# Patient Record
Sex: Female | Born: 1975 | Race: White | Hispanic: No | Marital: Married | Smoking: Never smoker
Health system: Southern US, Community
[De-identification: ages and names within clinical notes are randomized; demographics above are authoritative.]

## PROBLEM LIST (undated history)

## (undated) DIAGNOSIS — R002 Palpitations: Secondary | ICD-10-CM

## (undated) DIAGNOSIS — N943 Premenstrual tension syndrome: Secondary | ICD-10-CM

## (undated) DIAGNOSIS — K219 Gastro-esophageal reflux disease without esophagitis: Secondary | ICD-10-CM

## (undated) HISTORY — DX: Gastro-esophageal reflux disease without esophagitis: K21.9

## (undated) HISTORY — DX: Palpitations: R00.2

## (undated) HISTORY — PX: INTRAUTERINE DEVICE (IUD) INSERTION: SHX5877

## (undated) HISTORY — DX: Premenstrual tension syndrome: N94.3

## (undated) HISTORY — PX: WISDOM TOOTH EXTRACTION: SHX21

---

## 2003-11-20 ENCOUNTER — Inpatient Hospital Stay (HOSPITAL_COMMUNITY): Admission: AD | Admit: 2003-11-20 | Discharge: 2003-11-20 | Payer: Self-pay | Admitting: Obstetrics and Gynecology

## 2003-12-18 ENCOUNTER — Other Ambulatory Visit: Admission: RE | Admit: 2003-12-18 | Discharge: 2003-12-18 | Payer: Self-pay | Admitting: Obstetrics & Gynecology

## 2004-07-06 ENCOUNTER — Inpatient Hospital Stay (HOSPITAL_COMMUNITY): Admission: AD | Admit: 2004-07-06 | Discharge: 2004-07-06 | Payer: Self-pay | Admitting: Obstetrics & Gynecology

## 2004-07-07 ENCOUNTER — Inpatient Hospital Stay (HOSPITAL_COMMUNITY): Admission: AD | Admit: 2004-07-07 | Discharge: 2004-07-10 | Payer: Self-pay | Admitting: Obstetrics and Gynecology

## 2004-07-07 ENCOUNTER — Encounter (INDEPENDENT_AMBULATORY_CARE_PROVIDER_SITE_OTHER): Payer: Self-pay | Admitting: Specialist

## 2004-09-03 ENCOUNTER — Other Ambulatory Visit: Admission: RE | Admit: 2004-09-03 | Discharge: 2004-09-03 | Payer: Self-pay | Admitting: Obstetrics & Gynecology

## 2007-08-30 LAB — CONVERTED CEMR LAB: Pap Smear: NORMAL

## 2008-10-30 ENCOUNTER — Ambulatory Visit: Payer: Self-pay | Admitting: Family Medicine

## 2008-10-30 DIAGNOSIS — N943 Premenstrual tension syndrome: Secondary | ICD-10-CM | POA: Insufficient documentation

## 2008-10-30 HISTORY — DX: Premenstrual tension syndrome: N94.3

## 2009-01-06 ENCOUNTER — Ambulatory Visit: Payer: Self-pay | Admitting: Family Medicine

## 2009-04-04 ENCOUNTER — Ambulatory Visit: Payer: Self-pay | Admitting: Family Medicine

## 2009-04-09 ENCOUNTER — Ambulatory Visit: Payer: Self-pay | Admitting: Family Medicine

## 2009-08-11 ENCOUNTER — Ambulatory Visit: Payer: Self-pay | Admitting: Family Medicine

## 2009-08-11 LAB — CONVERTED CEMR LAB
Bilirubin Urine: NEGATIVE
Blood in Urine, dipstick: NEGATIVE
Glucose, Urine, Semiquant: NEGATIVE
Ketones, urine, test strip: NEGATIVE
Nitrite: NEGATIVE
Protein, U semiquant: NEGATIVE
Specific Gravity, Urine: <1.005
Urobilinogen, UA: NEGATIVE
WBC Urine, dipstick: NEGATIVE
pH: 5

## 2009-08-18 LAB — CONVERTED CEMR LAB
ALT: 17 units/L (ref 0–35)
AST: 23 units/L (ref 0–37)
Albumin: 4.5 g/dL (ref 3.5–5.2)
Alkaline Phosphatase: 87 units/L (ref 39–117)
BUN: 13 mg/dL (ref 6–23)
Basophils Absolute: 0 10*3/uL (ref 0.0–0.1)
Basophils Relative: 0.6 % (ref 0.0–3.0)
Bilirubin, Direct: 0.2 mg/dL (ref 0.0–0.3)
CO2: 26 meq/L (ref 19–32)
Calcium: 9.6 mg/dL (ref 8.4–10.5)
Chloride: 105 meq/L (ref 96–112)
Cholesterol: 175 mg/dL (ref 0–200)
Creatinine, Ser: 0.8 mg/dL (ref 0.4–1.2)
Eosinophils Absolute: 0.1 10*3/uL (ref 0.0–0.7)
Eosinophils Relative: 1.8 % (ref 0.0–5.0)
GFR calc non Af Amer: 86.32 mL/min (ref >60)
Glucose, Bld: 78 mg/dL (ref 70–99)
HCT: 42.4 % (ref 36.0–46.0)
HDL: 63.9 mg/dL (ref >39.00)
Hemoglobin: 13.9 g/dL (ref 12.0–15.0)
LDL Cholesterol: 102 mg/dL — ABNORMAL HIGH (ref 0–99)
Lymphocytes Relative: 24.2 % (ref 12.0–46.0)
Lymphs Abs: 1.8 10*3/uL (ref 0.7–4.0)
MCHC: 32.7 g/dL (ref 30.0–36.0)
MCV: 93.7 fL (ref 78.0–100.0)
Monocytes Absolute: 0.7 10*3/uL (ref 0.1–1.0)
Monocytes Relative: 9.2 % (ref 3.0–12.0)
Neutro Abs: 4.8 10*3/uL (ref 1.4–7.7)
Neutrophils Relative %: 64.2 % (ref 43.0–77.0)
Platelets: 191 10*3/uL (ref 150.0–400.0)
Potassium: 4.6 meq/L (ref 3.5–5.1)
RBC: 4.53 M/uL (ref 3.87–5.11)
RDW: 11.9 % (ref 11.5–14.6)
Sodium: 139 meq/L (ref 135–145)
TSH: 1.22 microintl units/mL (ref 0.35–5.50)
Total Bilirubin: 1.4 mg/dL — ABNORMAL HIGH (ref 0.3–1.2)
Total CHOL/HDL Ratio: 3
Total Protein: 7.7 g/dL (ref 6.0–8.3)
Triglycerides: 48 mg/dL (ref 0.0–149.0)
VLDL: 9.6 mg/dL (ref 0.0–40.0)
WBC: 7.4 10*3/uL (ref 4.5–10.5)

## 2009-09-08 ENCOUNTER — Ambulatory Visit: Payer: Self-pay | Admitting: Family Medicine

## 2009-11-12 ENCOUNTER — Telehealth: Payer: Self-pay | Admitting: Family Medicine

## 2010-08-18 NOTE — Assessment & Plan Note (Signed)
Summary: new to establish//fd   Vital Signs:  Patient profile:   35 year old female Height:      59 inches Weight:      147.2 pounds BMI:     29.84 Pulse rate:   58 / minute Resp:     12 per minute BP sitting:   112 / 64  (left arm)  Vitals Entered By: Doristine Devoid (October 30, 2008 8:11 AM) CC: new est- mood swings    History of Present Illness: 35 yo woman here today to establish care.  did not have prior PCP.  Taavon is GYN.  pt here today for mood swings.  pt reports that when she called yesterday 'it was the worst of it'.  for 2 weeks out of each month 'i'm very quick to yell, i lose motivation, i cry easily- the house falls apart'.  sxs x5 yrs.  pt knows that sxs occur during the 2 weeks preceding menses.  when menses starts pt's mood dramatically improves.  pt would label sxs as 'depression' during those 2 weeks and then for the other 2 weeks in the month is fine.  'i'm like 2 different people'.  no sxs of mania- no risk taking behavior, decreased need for sleep, spending money.  not on birth control- husband s/p vasectomy.  pt now exercising regularly- finds this is helping.  Preventive Screening-Counseling & Management     Smoking Status: never      Sexual History:  currently monogamous.    Allergies (verified): No Known Drug Allergies  Past History:  Past Surgical History:    Caesarean section  Past History:  Care Management:    Gynecology: Dr. Billy Coast  Family History:    CAD-no    HTN-no    DM-paternal grandmother    COLON CA-no    BREAST CA-no    STROKE-no  Social History:    married, 2 children (2002, 2006)    Smoking Status:  never    Sexual History:  currently monogamous  Review of Systems General:  Denies chills, fatigue, fever, and sleep disorder. Psych:  Complains of anxiety, depression, easily angered, easily tearful, and irritability; denies sense of great danger, suicidal thoughts/plans, thoughts of violence, unusual visions or sounds, and  thoughts /plans of harming others.  Physical Exam  General:  Well-developed,well-nourished,in no acute distress; alert,appropriate and cooperative throughout examination Neck:  No deformities, masses, or tenderness noted. Lungs:  Normal respiratory effort, chest expands symmetrically. Lungs are clear to auscultation, no crackles or wheezes. Heart:  Normal rate and regular rhythm. S1 and S2 normal without gallop, murmur, click, rub or other extra sounds. Extremities:  no C/C/E Psych:  Cognition and judgment appear intact. Alert and cooperative with normal attention span and concentration. 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  discussed at length treatment options- OCPs vs SSRI.  pt w/ hx of OCPs and severe yeast infxns- would prefer different option.  after r/o sxs of mania will start SSRI and follow closely.  encouraged lifestyle changes like diet and exercise.  pt in agreement.  Complete Medication List: 1)  Citalopram Hydrobromide 20 Mg Tabs (Citalopram hydrobromide) .... Take one tablet by mouth daily  Patient Instructions: 1)  Please schedule a follow-up appointment in 6 weeks. 2)  Take the Citalopram daily to help with mood 3)  Continue to exercise- this will only help things! 4)  Call with any questions or concerns 5)  Hang in there! Prescriptions: CITALOPRAM HYDROBROMIDE 20 MG TABS (CITALOPRAM HYDROBROMIDE) take one tablet by mouth daily  #30 x 3   Entered and Authorized by:   Neena Rhymes MD   Signed by:   Neena Rhymes MD on 10/30/2008   Method used:   Print then Give to Patient   RxID:   [id number]

## 2010-08-18 NOTE — Assessment & Plan Note (Signed)
Summary: 2 WEEK COUGH/NOT FEELING ANY BETTER/KDC   Vital Signs:  Patient profile:   35 year old female Weight:      143.8 pounds Temp:     98.6 degrees F oral BP sitting:   112 / 68  (left arm)  Vitals Entered By: Doristine Devoid (April 09, 2009 11:33 AM) CC: still w/ cough    History of Present Illness: 35 yo woman here today for persistant cough.  seen 5 days ago and tx'd w/ Zpack for bronchitis.  cough is unchanged since Zpack.  otherwise feels fine.  no fevers, chills, ear pain, congestion, sore throat.  cough is still intermittantly productive, hacking cough.  Current Medications (verified): 1)  Citalopram Hydrobromide 20 Mg Tabs (Citalopram Hydrobromide) .... Take One Tablet By Mouth Daily 2)  Tessalon 200 Mg Caps (Benzonatate) .... Take One Capsule By Mouth Three Times A Day As Needed For Cough 3)  Cheratussin Ac 100-10 Mg/68ml Syrp (Guaifenesin-Codeine) .Marland Kitchen.. 1-2 Tsps Q4-6 As Needed For Cough.  Disp  Allergies (verified): No Known Drug Allergies  Review of Systems      See HPI  Physical Exam  General:  Well-developed,well-nourished,in no acute distress; alert,appropriate and cooperative throughout examination Eyes:  no injxn or inflammation Nose:  External nasal examination shows no deformity or inflammation. Nasal mucosa are pink and moist without lesions or exudates. Mouth:  Oral mucosa and oropharynx without lesions or exudates.  Teeth in good repair. Lungs:  Normal respiratory effort, chest expands symmetrically. Lungs are clear to auscultation, no crackles or wheezes.  + hacking cough Heart:  Normal rate and regular rhythm. S1 and S2 normal without gallop, murmur, click, rub or other extra sounds.   Impression & Recommendations:  Problem # 1:  BRONCHITIS- ACUTE (ICD-466.0) Assessment Unchanged pt now feeling well but still w/ cough.  likely post-infectious at this point.  codeine cough syrup for night sxs and tessalon for day cough.  reviewed supportive  care and red flags that should prompt return.  Pt expresses understanding and is in agreement w/ this plan. Her updated medication list for this problem includes:    Tessalon 200 Mg Caps (Benzonatate) .Marland Kitchen... Take one capsule by mouth three times a day as needed for cough    Cheratussin Ac 100-10 Mg/16ml Syrp (Guaifenesin-codeine) .Marland Kitchen... 1-2 tsps q4-6 as needed for cough.  disp  Complete Medication List: 1)  Citalopram Hydrobromide 20 Mg Tabs (Citalopram hydrobromide) .... Take one tablet by mouth daily 2)  Tessalon 200 Mg Caps (Benzonatate) .... Take one capsule by mouth three times a day as needed for cough 3)  Cheratussin Ac 100-10 Mg/51ml Syrp (Guaifenesin-codeine) .Marland Kitchen.. 1-2 tsps q4-6 as needed for cough.  disp  Patient Instructions: 1)  Please schedule a follow-up appointment as needed. 2)  Take the cough syrup at night (will make you drowsy) 3)  Use the cough pills (Tessalon) for day cough- can take w/ Robitussin if needed 4)  Drink plenty of fluids 5)  Ibuprofen every 6-8 hrs for airway inflammation 6)  Hang in there! Prescriptions: CHERATUSSIN AC 100-10 MG/5ML SYRP (GUAIFENESIN-CODEINE) 1-2 tsps Q4-6 as needed for cough.  disp  #150 x 0   Entered and Authorized by:   Neena Rhymes MD   Signed by:   Neena Rhymes MD on 04/09/2009   Method used:   Print then Give to Patient   RxID:   [id number] TESSALON 200 MG CAPS (BENZONATATE) Take one capsule by mouth three times a day as needed  for cough  #60 x 0   Entered and Authorized by:   Neena Rhymes MD   Signed by:   Neena Rhymes MD on 04/09/2009   Method used:   Print then Give to Patient   RxID:   [id number]

## 2010-08-18 NOTE — Letter (Signed)
Summary: Cancer Screening/[facility] Personalized Risk Profile  Cancer Screening/[facility] Personalized Risk Profile   Imported By: Lanelle Bal 08/15/2009 12:21:31  _____________________________________________________________________  External Attachment:    Type:   Image     Comment:   External Document

## 2010-08-18 NOTE — Assessment & Plan Note (Signed)
Summary: FU ON MEDS/K[facility]   Vital Signs:  Patient profile:   35 year old female Weight:      146.2 pounds Pulse rate:   68 / minute BP sitting:   116 / 70  (left arm)  Vitals Entered By: Doristine Devoid (January 06, 2009 1:38 PM) CC: f/u on meds    History of Present Illness: 35 yo woman here today to f/u on meds.  pt reports feeling much better since starting citalopram.  still w/ PMS but 'normal'- irritated but no depressive sxs.  'it's an amazing difference'.  both husband and son have noticed- 'you don't yell as much as used to'.    Allergies: No Known Drug Allergies  Review of Systems      See HPI  Physical Exam  General:  Well-developed,well-nourished,in no acute distress; alert,appropriate and cooperative throughout examination Psych:  Cognition and judgment appear intact. Alert and cooperative with normal attention span and concentration. No apparent delusions, illusions, hallucinations   Impression & Recommendations:  Problem # 1:  PREMENSTRUAL DYSPHORIC SYNDROME (ICD-625.4) Assessment Improved pt's sxs much improved since starting SSRI.  no changes at this time.  will follow.  Complete Medication List: 1)  Citalopram Hydrobromide 20 Mg Tabs (Citalopram hydrobromide) .... Take one tablet by mouth daily  Patient Instructions: 1)  Please schedule a complete physical at your convenience- if blood work done at [facility] do not have to fast 2)  Continue the Citalopram daily as directed 3)  Call with any questions or concerns! 4)  Have a great summer!! Prescriptions: CITALOPRAM HYDROBROMIDE 20 MG TABS (CITALOPRAM HYDROBROMIDE) take one tablet by mouth daily  #30 x 6   Entered and Authorized by:   Neena Rhymes MD   Signed by:   Neena Rhymes MD on 01/06/2009   Method used:   Electronically to        [facility] (retail)       [address]       [place], [place]  [address]       Ph: [phone]       Fax: [phone]   RxID:    [id number]

## 2010-08-18 NOTE — Assessment & Plan Note (Signed)
Summary: CPX AND SHOTS AND FASTING LABS//PH   Vital Signs:  Patient profile:   34 year old female Height:      60 inches Weight:      148 pounds BMI:     29.01 Temp:     98.0 degrees F oral Pulse rate:   69 / minute Pulse rhythm:   regular BP sitting:   129 / 78  (left arm) Cuff size:   regular  Vitals Entered By: Army Fossa CMA (August 11, 2009 8:28 AM) CC: CPX, no pap, no compliants. Pt is fasting. Pt needs varicella and boostrix.    History of Present Illness: Pt here for cpe---no pap.  Pt has gyn.   No complaints.    Preventive Screening-Counseling & Management  Alcohol-Tobacco     Alcohol drinks/day: <1     Alcohol type: wine     Alcohol Counseling: not indicated; use of alcohol is not excessive or problematic     Smoking Status: never  Caffeine-Diet-Exercise     Caffeine use/day: 2     Does Patient Exercise: yes     Type of exercise: running, weights     Exercise (avg: min/session): 30-60     Times/week: 5     Exercise Counseling: not indicated; exercise is adequate  Hep-HIV-STD-Contraception     Dental Visit-last 6 months yes     Dental Care Counseling: not indicated; dental care within six months     SBE monthly: no     SBE Education/Counseling: to perform regular SBE  Safety-Violence-Falls     Seat Belt Use: yes     Seat Belt Counseling: not indicated; patient wears seat belts      Sexual History:  currently monogamous and married-- 2 children.        Drug Use:  no.        Blood Transfusions:  no.    Current Medications (verified): 1)  Citalopram Hydrobromide 20 Mg Tabs (Citalopram Hydrobromide) .... Take One Tablet By Mouth Daily  Allergies (verified): 1)  ! Percocet  Past History:  Past Surgical History: Last updated: 10/30/2008 Caesarean section  Family History: Last updated: 08/11/2009 CAD-no HTN-no DM-paternal grandmother COLON CA-no BREAST CA-no STROKE-no Family History Diabetes 1st degree relative Father ---brain  aneurysm Family History High cholesterol Family History Hypertension PGF--stomach and throat CA  Social History: Last updated: 08/11/2009 married, 2 children (2002, 2006) Occupation: [employer] Married Never Smoked Alcohol use-yes Drug use-no Regular exercise-yes  Risk Factors: Alcohol Use: <1 (08/11/2009) Caffeine Use: 2 (08/11/2009) Exercise: yes (08/11/2009)  Risk Factors: Smoking Status: never (08/11/2009)  Past Medical History: Unremarkable  Family History: Reviewed history from 10/30/2008 and no changes required. CAD-no HTN-no DM-paternal grandmother COLON CA-no BREAST CA-no STROKE-no Family History Diabetes 1st degree relative Father ---brain aneurysm Family History High cholesterol Family History Hypertension PGF--stomach and throat CA  Social History: Reviewed history from 10/30/2008 and no changes required. married, 2 children (2002, 2006) Occupation: [employer] Married Never Smoked Alcohol use-yes Drug use-no Regular exercise-yes Does Patient Exercise:  yes Caffeine use/day:  2 Dental Care w/in 6 mos.:  yes Seat Belt Use:  yes Sexual History:  currently monogamous, married-- 2 children Blood Transfusions:  no Occupation:  employed Drug Use:  no  Review of Systems      See HPI General:  Denies chills, fatigue, fever, loss of appetite, malaise, sleep disorder, sweats, weakness, and weight loss. Eyes:  Denies blurring, discharge, double vision, eye irritation, eye pain, halos, itching, light sensitivity, red eye, vision  loss-1 eye, and vision loss-both eyes; optho-- q2y . ENT:  Denies decreased hearing, difficulty swallowing, ear discharge, earache, hoarseness, nasal congestion, nosebleeds, postnasal drainage, ringing in ears, sinus pressure, and sore throat. CV:  Denies bluish discoloration of lips or nails, chest pain or discomfort, difficulty breathing at night, difficulty breathing while lying down, fainting, fatigue, leg cramps with exertion,  lightheadness, near fainting, palpitations, shortness of breath with exertion, swelling of feet, swelling of hands, and weight gain. Resp:  Denies chest discomfort, chest pain with inspiration, cough, coughing up blood, excessive snoring, hypersomnolence, morning headaches, pleuritic, shortness of breath, sputum productive, and wheezing. GI:  Denies abdominal pain, bloody stools, change in bowel habits, constipation, dark tarry stools, diarrhea, excessive appetite, gas, hemorrhoids, indigestion, loss of appetite, nausea, vomiting, vomiting blood, and yellowish skin color. GU:  Denies abnormal vaginal bleeding, decreased libido, discharge, dysuria, genital sores, hematuria, incontinence, nocturia, urinary frequency, and urinary hesitancy. MS:  Denies joint pain, joint redness, joint swelling, loss of strength, low back pain, mid back pain, muscle aches, muscle , cramps, muscle weakness, stiffness, and thoracic pain. Derm:  Denies changes in color of skin, changes in nail beds, dryness, excessive perspiration, flushing, hair loss, insect bite(s), itching, lesion(s), poor wound healing, and rash. Neuro:  Denies brief paralysis, difficulty with concentration, disturbances in coordination, falling down, headaches, inability to speak, memory loss, numbness, poor balance, seizures, sensation of room spinning, tingling, tremors, visual disturbances, and weakness. Psych:  Denies alternate hallucination ( auditory/visual), anxiety, depression, easily angered, easily tearful, irritability, mental problems, panic attacks, sense of great danger, suicidal thoughts/plans, thoughts of violence, unusual visions or sounds, and thoughts /plans of harming others. Endo:  Denies cold intolerance, excessive hunger, excessive thirst, excessive urination, heat intolerance, polyuria, and weight change. Heme:  Denies abnormal bruising, bleeding, enlarge lymph nodes, fevers, pallor, and skin discoloration. Allergy:  Denies hives or  rash, itching eyes, persistent infections, seasonal allergies, and sneezing.  Physical Exam  General:  Well-developed,well-nourished,in no acute distress; alert,appropriate and cooperative throughout examination Head:  Normocephalic and atraumatic without obvious abnormalities. No apparent alopecia or balding. Eyes:  pupils equal, pupils round, pupils reactive to light, and no injection.   Ears:  External ear exam shows no significant lesions or deformities.  Otoscopic examination reveals clear canals, tympanic membranes are intact bilaterally without bulging, retraction, inflammation or discharge. Hearing is grossly normal bilaterally. Nose:  External nasal examination shows no deformity or inflammation. Nasal mucosa are pink and moist without lesions or exudates. Mouth:  Oral mucosa and oropharynx without lesions or exudates.  Teeth in good repair. Neck:  No deformities, masses, or tenderness noted. Chest Wall:  No deformities, masses, or tenderness noted. Breasts:  gyn Lungs:  Normal respiratory effort, chest expands symmetrically. Lungs are clear to auscultation, no crackles or wheezes. Heart:  Normal rate and regular rhythm. S1 and S2 normal without gallop, murmur, click, rub or other extra sounds. Abdomen:  Bowel sounds positive,abdomen soft and non-tender without masses, organomegaly or hernias noted. Genitalia:  gyn  Msk:  normal ROM, no joint tenderness, no joint swelling, no joint warmth, no redness over joints, no joint deformities, no joint instability, and no crepitation.   Pulses:  R posterior tibial normal, R dorsalis pedis normal, L posterior tibial normal, and L dorsalis pedis normal.   Extremities:  No clubbing, cyanosis, edema, or deformity noted with normal full range of motion of all joints.   Neurologic:  alert & oriented X3, cranial nerves II-XII intact, strength normal in all extremities,  and gait normal.   Skin:  Intact without suspicious lesions or rashes Cervical  Nodes:  No lymphadenopathy noted Axillary Nodes:  No palpable lymphadenopathy Psych:  Cognition and judgment appear intact. Alert and cooperative with normal attention span and concentration. No apparent delusions, illusions, hallucinations    Flu Vaccine Consent Questions     Do you have a history of severe allergic reactions to this vaccine? no    Any prior history of allergic reactions to egg and/or gelatin? no    Do you have a sensitivity to the preservative Thimersol? no    Do you have a past history of Guillan-Barre Syndrome? no    Do you currently have an acute febrile illness? no    Have you ever had a severe reaction to latex? no    Vaccine information given and explained to patient? yes    Are you currently pregnant? no    Lot Number:[id number]   Exp Date:01/15/2010   Site Given  right Deltoid IM .lbflu  Impression & Recommendations:  Problem # 1:  PREVENTIVE HEALTH CARE (ICD-V70.0) ghm utd Orders: Venipuncture ([address]) TLB-Lipid Panel (80061-LIPID) TLB-BMP (Basic Metabolic Panel-BMET) (80048-METABOL) TLB-CBC Platelet - w/Differential (85025-CBCD) TLB-Hepatic/Liver Function Pnl (80076-HEPATIC) TLB-TSH (Thyroid Stimulating Hormone) (84443-TSH) UA Dipstick W/ Micro (manual) (60454)  Problem # 2:  PREMENSTRUAL DYSPHORIC SYNDROME (ICD-625.4)  cont celexa pt may need 30 mg---  she will wait another month.  If she has problems with moods next month again whe will increase dose.    Complete Medication List: 1)  Citalopram Hydrobromide 20 Mg Tabs (Citalopram hydrobromide) .... Take one tablet by mouth daily  Other Orders: Varicella  ([address]) Admin 1st Vaccine [phone]) Tdap => 14yrs IM (29562) Admin of Any Addtl Vaccine (13086) Admin 1st Vaccine (57846) Flu Vaccine 34yrs + ([address]) Prescriptions: CITALOPRAM HYDROBROMIDE 20 MG TABS (CITALOPRAM HYDROBROMIDE) take one tablet by mouth daily  #30 x 6   Entered and Authorized by:   Loreen Freud DO   Signed by:   Loreen Freud DO  on 08/11/2009   Method used:   Electronically to        [facility] (retail)       [address]       [place], [place]  [address]       Ph: [phone]       Fax: [phone]   RxID:   [id number]    Immunizations Administered:  Varicella Vaccine # 1:    Vaccine Type: Varicella    Site: right deltoid    Mfr: Merck    Dose: 0.5 ml    Route: IM    Given by: Army Fossa CMA    Exp. Date: 02/06/2011    Lot #: [id number]  Tetanus Vaccine:    Vaccine Type: Tdap    Site: left deltoid    Mfr: GlaxoSmithKline    Dose: 0.5 ml    Route: IM    Given by: Army Fossa CMA    Exp. Date: 09/13/2011    Lot #: [id number]   Immunizations Administered:  Varicella Vaccine # 1:    Vaccine Type: Varicella    Site: right deltoid    Mfr: Merck    Dose: 0.5 ml    Route: IM    Given by: Army Fossa CMA    Exp. Date: 02/06/2011    Lot #: [id number]  Tetanus Vaccine:    Vaccine Type: Tdap    Site: left deltoid  Mfr: GlaxoSmithKline    Dose: 0.5 ml    Route: IM    Given by: Army Fossa CMA    Exp. Date: 09/13/2011    Lot #: [id number]   Flu Vaccine Result Date:  08/11/2009 Flu Vaccine Result:  given Flu Vaccine Next Due:  1 yr TD Result Date:  08/11/2009 TD Result:  given TD Next Due:  10 yr PAP Result Date:  08/30/2007 PAP Result:  normal PAP Next Due:  1 yr   Laboratory Results   Urine Tests   Date/Time Reported: August 11, 2009 10:40 AM   Routine Urinalysis   Color: lt. yellow Appearance: Clear Glucose: negative   (Normal Range: Negative) Bilirubin: negative   (Normal Range: Negative) Ketone: negative   (Normal Range: Negative) Spec. Gravity: <1.005   (Normal Range: 1.003-1.035) Blood: negative   (Normal Range: Negative) pH: 5.0   (Normal Range: 5.0-8.0) Protein: negative   (Normal Range: Negative) Urobilinogen: negative   (Normal Range: 0-1) Nitrite: negative   (Normal Range: Negative) Leukocyte Esterace:  negative   (Normal Range: Negative)    Comments: Floydene Flock  August 11, 2009 10:40 AM

## 2010-08-18 NOTE — Progress Notes (Signed)
Summary: refill-low[place]  Phone Note Refill Request Call back at Home Phone [phone] Message from:  Patient  Refills Requested: Medication #1:  CITALOPRAM HYDROBROMIDE 20 MG TABS take one tablet by mouth daily. pt states that she discuss increase med to 30mg .[facility] [address]. pls advise pt aware out of office until thursday 11-13-09  Initial call taken by: [facility] CMA,  November 12, 2009 4:04 PM  Follow-up for Phone Call        1 1/2 tab by mouth once daily   # 45  5 refills Follow-up by: Loreen Freud DO,  November 13, 2009 9:36 AM  Additional Follow-up for Phone Call Additional follow up Details #1::        Called pt and left a message that we sent in a new rx with directions to the [facility] [address]. Army Fossa CMA  November 13, 2009 9:49 AM     New/Updated Medications: CITALOPRAM HYDROBROMIDE 20 MG TABS (CITALOPRAM HYDROBROMIDE) take 1 1/2  tablet by mouth daily Prescriptions: CITALOPRAM HYDROBROMIDE 20 MG TABS (CITALOPRAM HYDROBROMIDE) take 1 1/2  tablet by mouth daily  #45 x 5   Entered by:   Army Fossa CMA   Authorized by:   Loreen Freud DO   Signed by:   Army Fossa CMA on 11/13/2009   Method used:   Electronically to        [facility] (retail)       [address]       [place], [place]  [address]       Ph: [phone]       Fax: [phone]   RxID:   [id number]

## 2010-08-18 NOTE — Assessment & Plan Note (Signed)
Summary: ACUTE ONLY FOR A COUGH//PH   Vital Signs:  Patient profile:   35 year old female Weight:      140.13 pounds Temp:     98.6 degrees F oral Pulse rate:   72 / minute Pulse rhythm:   regular BP sitting:   118 / 72  (left arm) Cuff size:   regular  Vitals Entered By: Army Fossa CMA (April 04, 2009 12:53 PM) CC: cough with mucus in it, she has tried mucinex and claritin. , URI symptoms   History of Present Illness:       This is a 35 year old woman who presents with URI symptoms.  The patient complains of productive cough, but denies nasal congestion, clear nasal discharge, purulent nasal discharge, sore throat, dry cough, earache, and sick contacts.  The patient denies fever, low-grade fever (<100.5 degrees), fever of 100.5-103 degrees, fever of 103.1-104 degrees, fever to >104 degrees, stiff neck, dyspnea, wheezing, rash, vomiting, diarrhea, use of an antipyretic, and response to antipyretic.  The patient denies itchy watery eyes, itchy throat, sneezing, seasonal symptoms, response to antihistamine, headache, muscle aches, and severe fatigue.  The patient denies the following risk factors for Strep sinusitis: unilateral facial pain, unilateral nasal discharge, poor response to decongestant, double sickening, tooth pain, Strep exposure, tender adenopathy, and absence of cough.    Current Medications (verified): 1)  Citalopram Hydrobromide 20 Mg Tabs (Citalopram Hydrobromide) .... Take One Tablet By Mouth Daily 2)  Zithromax Z-Pak 250 Mg Tabs (Azithromycin) .... 2 By Mouth X1 Day Then 1 By Mouth Once Daily For 4 More Days  Allergies (verified): No Known Drug Allergies  Past History:  Past medical, surgical, family and social histories (including risk factors) reviewed, and no changes noted (except as noted below).  Past Surgical History: Reviewed history from 10/30/2008 and no changes required. Caesarean section  Family History: Reviewed history from 10/30/2008 and  no changes required. CAD-no HTN-no DM-paternal grandmother COLON CA-no BREAST CA-no STROKE-no  Social History: Reviewed history from 10/30/2008 and no changes required. married, 2 children (2002, 2006)  Review of Systems      See HPI  Physical Exam  General:  Well-developed,well-nourished,in no acute distress; alert,appropriate and cooperative throughout examination Ears:  External ear exam shows no significant lesions or deformities.  Otoscopic examination reveals clear canals, tympanic membranes are intact bilaterally without bulging, retraction, inflammation or discharge. Hearing is grossly normal bilaterally. Nose:  External nasal examination shows no deformity or inflammation. Nasal mucosa are pink and moist without lesions or exudates. Mouth:  Oral mucosa and oropharynx without lesions or exudates.  Teeth in good repair. Neck:  No deformities, masses, or tenderness noted. Lungs:  Normal respiratory effort, chest expands symmetrically. Lungs are clear to auscultation, no crackles or wheezes. Heart:  Normal rate and regular rhythm. S1 and S2 normal without gallop, murmur, click, rub or other extra sounds. Psych:  Cognition and judgment appear intact. Alert and cooperative with normal attention span and concentration. No apparent delusions, illusions, hallucinations   Impression & Recommendations:  Problem # 1:  BRONCHITIS- ACUTE (ICD-466.0)  Her updated medication list for this problem includes:    Zithromax Z-pak 250 Mg Tabs (Azithromycin) .[facility]... 2 by mouth x1 day then 1 by mouth once daily for 4 more days     Mucinex  Take antibiotics and other medications as directed. Encouraged to push clear liquids, get enough rest, and take acetaminophen as needed. To be seen in 5-7 days if no improvement, sooner if  worse.  Complete Medication List: 1)  Citalopram Hydrobromide 20 Mg Tabs (Citalopram hydrobromide) .... Take one tablet by mouth daily 2)  Zithromax Z-pak 250 Mg Tabs  (Azithromycin) .... 2 by mouth x1 day then 1 by mouth once daily for 4 more days Prescriptions: ZITHROMAX Z-PAK 250 MG TABS (AZITHROMYCIN) 2 by mouth x1 day then 1 by mouth once daily for 4 more days  #1 x 0   Entered and Authorized by:   Loreen Freud DO   Signed by:   Loreen Freud DO on 04/04/2009   Method used:   Electronically to        [facility] ([facility]ail)       [address]       [place], [place]  [address]       Ph: [phone]       Fax: [phone]   RxID:   [id number]

## 2010-09-01 ENCOUNTER — Telehealth (INDEPENDENT_AMBULATORY_CARE_PROVIDER_SITE_OTHER): Payer: Self-pay | Admitting: *Deleted

## 2010-09-09 NOTE — Progress Notes (Signed)
Summary: refill  Phone Note Refill Request Message from:  Fax from Pharmacy on September 01, 2010 8:20 AM  Refills Requested: Medication #1:  CITALOPRAM HYDROBROMIDE 20 MG TABS take 1 1/2  tablet by mouth daily. [facility] - fax [phone]  Initial call taken by: Okey Regal [place],  September 01, 2010 8:21 AM    Prescriptions: CITALOPRAM HYDROBROMIDE 20 MG TABS (CITALOPRAM HYDROBROMIDE) take 1 1/2  tablet by mouth daily  #45 Tablet x 2   Entered by:   Doristine Devoid CMA   Authorized by:   Neena Rhymes MD   Signed by:   Doristine Devoid CMA on 09/01/2010   Method used:   Electronically to        [facility])       [address]       [place], [place]  [address]       Ph: [phone]       Fax: [phone]   RxID:   [id number]

## 2010-11-16 ENCOUNTER — Encounter: Payer: Self-pay | Admitting: Family Medicine

## 2010-12-04 NOTE — Discharge Summary (Signed)
Phyllis Shelton, Phyllis Shelton             ACCOUNT NO.:  [id number]   MEDICAL RECORD NO.:  [id number]          PATIENT TYPE:  INP   LOCATION:  9141                          FACILITY:  [facility]   PHYSICIAN:  Duke Salvia. Marcelle Overlie, M.D.DATE OF BIRTH:  [birth year]   DATE OF ADMISSION:  07/07/2004  DATE OF DISCHARGE:  07/10/2004                                 DISCHARGE SUMMARY   ADMITTING DIAGNOSES:  1.  Intrauterine pregnancy at 17 weeks estimated gestational age.  2.  Spontaneous rupture of membranes.  3.  Meconium-stained fluid.   DISCHARGE DIAGNOSIS:  Status post low transverse cesarean section secondary  to failure to progress and nonreassuring fetal heart tones.   PROCEDURE:  Primary low transverse cesarean section.   REASON FOR ADMISSION:  Please see written H&P.   HOSPITAL COURSE:  The patient was a 35 year old gravida 2 para 1 that  presented to [facility] at 38 weeks estimated gestational  age in active labor with spontaneous rupture of membranes.  Amniotic fluid  was noted to be moderately stained with meconium and cervix was found to be  7 cm dilated with the patient contracting approximately every 2-4 minutes.  The patient did undergo amnioinfusion for moderate meconium fluid.  After  approximately 4 hours the patient did progress to 8-9 cm.  However, 2-and-a-  half hours with adequate labor, the patient made no further progression.  Fetal vertex was noted to be in the occiput posterior position.  The fetus  did develop fetal tachycardia with heart rate in the 70s and 80s and was  losing beat-to-beat variability.  Due to failure to progress with occiput  posterior presentation and fetal tachycardia, decision was made to proceed  with a primary low transverse cesarean section.  The patient was then  transferred to operating room where epidural was dosed to an adequate  surgical level.  A low transverse incision was made with the delivery of a  viable female infant  weighing 7 pounds 6 ounces with Apgars of 9 at one  minute and 9 at five minutes.  Arterial cord pH was 7.30.  The patient  tolerated the procedure well and was taken to the recovery room in stable  condition.  On postoperative day #1, vital signs were stable, she was  afebrile.  Fundus was firm and nontender.  Abdominal dressing was noted to  be clean, dry, and intact.  Labs revealed hemoglobin of 13.0; platelet count  176,000; wbc count of 26.3.  On postoperative day #2, vital signs were  stable, the patient was afebrile.  Abdomen was soft, fundus was firm and  nontender.  Abdominal dressing had been removed revealing an incision that  was clean, dry, and intact.  Labs revealed hemoglobin of 11.6; platelet  count 187,000; wbc count of 22.2.  On postoperative day #3, the patient was  without complaint.  Vital signs remained stable, she was afebrile.  Fundus  was firm and nontender.  Incision was clean, dry, and intact.  Staples were  removed and the patient was discharged home.   CONDITION ON DISCHARGE:  Good.   DIET:  Regular  as tolerated.   ACTIVITY:  No heavy lifting, no driving x2 weeks, no vaginal entry.   FOLLOW-UP:  The patient is to follow up in the office in 1-2 weeks for an  incision check.  She is to call for a temperature greater than 100 degree,  persistent nausea and vomiting, heavy vaginal bleeding, and/or redness or  drainage from the incisional site.   DISCHARGE MEDICATIONS:  1.  Tylox #30 one p.o. q.4-6h. p.r.n.  2.  Motrin 600 mg q.6h.  3.  Prenatal vitamins one p.o. daily.  4.  Colace one p.o. daily p.r.n.     Caro   CC/MEDQ  D:  07/27/2004  T:  07/27/2004  Job:  [id number]

## 2010-12-04 NOTE — Op Note (Signed)
NAMETOIA, Phyllis Shelton             ACCOUNT NO.:  [id number]   MEDICAL RECORD NO.:  [id number]          PATIENT TYPE:  INP   LOCATION:  9141                          FACILITY:  [facility]   PHYSICIAN:  Dineen Kid. Rana Snare, M.D.    DATE OF BIRTH:  July 08, 1976   DATE OF PROCEDURE:  07/07/2004  DATE OF DISCHARGE:                                 OPERATIVE REPORT   PREOPERATIVE DIAGNOSES:  1.  Intrauterine pregnancy at 38 weeks.  2.  Meconium.  3.  Fetal tachycardia.  4.  Failure to progress.  5.  Occiput posterior presentation.   POSTOPERATIVE DIAGNOSES:  1.  Intrauterine pregnancy at 38 weeks.  2.  Meconium.  3.  Fetal tachycardia.  4.  Failure to progress.  5.  Occiput posterior presentation.   PROCEDURE:  Primary low segment transverse cesarean section.   SURGEON:  Dineen Kid. Rana Snare, M.D.   ANESTHESIA:  Epidural.   INDICATIONS:  Ms. Tennison is a 35 year old G2, P1, who presented at 38 weeks  in active labor with spontaneous rupture of membranes with moderate meconium  and 7 cm dilated, contracting every two to four minutes.  She underwent an  amnioinfusion for moderate meconium and after four hours progressed to 8-9  cm dilated, despite adequate labor for 2-1/2 hours after that had no further  progression beyond 8 cm.  Was found to be in occiput posterior presentation.  The fetus began developing fetal tachycardia with heart rate in the 170s-  180s, losing its beat-to-beat variability.  Because of failure to progress,  occiput posterior presentation, and fetal tachycardia, at this point  proceeded with a primary low segment transverse cesarean section.  Risks and  benefits were discussed, informed consent was obtained.   FINDINGS AT THE TIME OF SURGERY:  A viable female infant, Apgars were 9 and  9, pH arterial 7.30.   DESCRIPTION OF PROCEDURE:  After adequate analgesia, the patient was placed  in a supine position with left lateral tilt.  She was sterilely prepped and  draped, the  bladder was sterilely drained with a Foley catheter.  A  Pfannenstiel skin incision was made two fingerbreadths above the pubic  symphysis, taken down sharply to the fascia, which was incised transversely,  extended superiorly and inferiorly off the bellies of the rectus muscle,  which was separated sharply in the midline.  The peritoneum was entered  sharply, a bladder flap was created and placed behind the bladder blade.  A  low segment myotomy incision was made down to the infant's vertex, extended  laterally with the operator's fingertips.  The infant's vertex was then  delivered.  The nares and pharynx were DeLee suctioned.  Nuchal cord x1 was  reduced.  The infant was then delivered, the cord clamped and cut, and  handed to the pediatricians for resuscitation.  Cord blood was obtained,  placenta extracted manually.  The uterus was exteriorized, wiped clean with  a dry lap.  The myotomy incision was closed with two layers, the first being  a running locking layer with the second being an imbricating layer of 0  Monocryl suture,  with good approximation and good hemostasis.  The uterus  was placed back in the peritoneal cavity and after a copious amount of  irrigation, adequate hemostasis was assured.  The peritoneum was closed with  0 Monocryl, the rectus muscle plicated in the midline, irrigation was  applied, and after adequate hemostasis the fascia was closed with a #1  Vicryl in a running fashion.  Irrigation was applied and after adequate  hemostasis, the skin was stapled and Steri-Strips applied.  The patient  tolerated the procedure well, was stable on transfer to the recovery room.  The sponge and instrument count was normal x3.  Estimated blood loss 800 mL.  The patient received 1 g of Rocephin after delivery of the placenta.     Davi   DCL/MEDQ  D:  07/07/2004  T:  07/08/2004  Job:  [id number]

## 2011-04-19 ENCOUNTER — Ambulatory Visit: Payer: Self-pay | Admitting: Family Medicine

## 2011-08-09 ENCOUNTER — Ambulatory Visit (INDEPENDENT_AMBULATORY_CARE_PROVIDER_SITE_OTHER): Payer: BC Managed Care – PPO | Admitting: Physician Assistant

## 2011-08-09 DIAGNOSIS — M6740 Ganglion, unspecified site: Secondary | ICD-10-CM

## 2011-08-09 DIAGNOSIS — M25539 Pain in unspecified wrist: Secondary | ICD-10-CM

## 2011-10-12 ENCOUNTER — Ambulatory Visit (INDEPENDENT_AMBULATORY_CARE_PROVIDER_SITE_OTHER): Payer: BC Managed Care – PPO | Admitting: Family Medicine

## 2011-10-12 VITALS — BP 110/77 | HR 60 | Temp 98.8°F | Resp 16 | Ht 59.5 in | Wt 150.0 lb

## 2011-10-12 DIAGNOSIS — R002 Palpitations: Secondary | ICD-10-CM

## 2011-10-12 LAB — COMPREHENSIVE METABOLIC PANEL
ALT: 15 U/L (ref 0–35)
AST: 18 U/L (ref 0–37)
Albumin: 4.8 g/dL (ref 3.5–5.2)
Alkaline Phosphatase: 90 U/L (ref 39–117)
BUN: 16 mg/dL (ref 6–23)
CO2: 26 mEq/L (ref 19–32)
Calcium: 10 mg/dL (ref 8.4–10.5)
Chloride: 105 mEq/L (ref 96–112)
Creat: 0.93 mg/dL (ref 0.50–1.10)
Glucose, Bld: 92 mg/dL (ref 70–99)
Potassium: 4.5 mEq/L (ref 3.5–5.3)
Sodium: 141 mEq/L (ref 135–145)
Total Bilirubin: 0.5 mg/dL (ref 0.3–1.2)
Total Protein: 7.2 g/dL (ref 6.0–8.3)

## 2011-10-12 LAB — POCT CBC
Granulocyte percent: 65.2 %G (ref 37–80)
HCT, POC: 39.1 % (ref 37.7–47.9)
Hemoglobin: 13.6 g/dL (ref 12.2–16.2)
Lymph, poc: 2.2 (ref 0.6–3.4)
MCH, POC: 31.1 pg (ref 27–31.2)
MCHC: 34.8 g/dL (ref 31.8–35.4)
MCV: 89.4 fL (ref 80–97)
MID (cbc): 0.6 (ref 0–0.9)
MPV: 10.9 fL (ref 0–99.8)
POC Granulocyte: 5.3 (ref 2–6.9)
POC LYMPH PERCENT: 27 %L (ref 10–50)
POC MID %: 7.8 %M (ref 0–12)
Platelet Count, POC: 219 10*3/uL (ref 142–424)
RBC: 4.37 M/uL (ref 4.04–5.48)
RDW, POC: 13.6 %
WBC: 8.2 10*3/uL (ref 4.6–10.2)

## 2011-10-12 LAB — TSH: TSH: 1.986 u[IU]/mL (ref 0.350–4.500)

## 2011-10-12 NOTE — Progress Notes (Signed)
  Patient Name: Phyllis Shelton Date of Birth: 1976/01/31 Medical Record Number: [id number] Gender: female Date of Encounter: 10/12/2011  History of Present Illness:  Phyllis Shelton is a 36 y.o. very pleasant female patient who presents with the following:  Has noted "fluttering" in her heart that she has noted for the last week or so.  Not CP- is an awareness of her heart- reminds her of when she was pregnant and could feel her baby kick but in the heart area.  Palpitations have been occuring a few times a day- yesterday they occurred more frequently. Sensation lasts about 30 seconds and feels as though her heart is skipping- not racing.   She has started exercising more and has changed her diet since January.  She is doing boot camp every week day.  She has lost about 12 lbs and is pleased about this!  However, she does admit that she had to get up at 4:30 for boot camp and is not getting enough sleep. The palpatations do not seem to occur while she is exercising.  She has not noted syncope or any other issues with exercise.  She has no changes in her exercise tolerance.   Never had palpitations in the past.  No known family history of CAD or other heart problems. No tobacco.  She is not taking diet pills.  She does drink 2 or 3 cups of coffee daily but this is not a new thing.    Patient Active Problem List  Diagnoses  . PREMENSTRUAL DYSPHORIC SYNDROME   No past medical history on file. No past surgical history on file. History  Substance Use Topics  . Smoking status: Never Smoker   . Smokeless tobacco: Not on file  . Alcohol Use: Not on file   No family history on file. Allergies  Allergen Reactions  . Darvocet (Propoxyphene N-Acetaminophen)   . Oxycodone-Acetaminophen    Medication list has been reviewed and updated.  Review of Systems: As per HPI- otherwise negative.  Physical Examination: Filed Vitals:   10/12/11 0829  BP: 110/77  Pulse: 60  Temp: 98.8 F (37.1 C)    TempSrc: Oral  Resp: 16  Height: 4' 11.5" (1.511 m)  Weight: 150 lb (68.04 kg)    Body mass index is 29.79 kg/(m^2).  GEN: WDWN, NAD, Non-toxic, A & O x 3, overweight HEENT: Atraumatic, Normocephalic. Neck supple. No masses, No LAD. Ears and Nose: No external deformity. CV: RRR, No M/G/R. No JVD. No thrill. No extra heart sounds. PULM: CTA B, no wheezes, crackles, rhonchi. No retractions. No resp. distress. No accessory muscle use. EXTR: No c/c/e NEURO Normal gait.  PSYCH: Normally interactive. Conversant. Not depressed or anxious appearing.  Calm demeanor.   EKG: sinus brady- question WPW Assessment and Plan: 1. Palpitations  EKG 12-Lead, POCT CBC, Comprehensive metabolic panel, TSH   Referral to cardiology for further evaluation.  Discussed with MD on call who does not feel certain that her EKG represents WPW.  I asked her to try and get more sleep, hold off on boot camp until she sees cardiolgoy and try to decrease caffeine. She will let us know if anything changes or gets worse in the meantime.

## 2011-10-13 ENCOUNTER — Encounter: Payer: Self-pay | Admitting: Family Medicine

## 2011-10-15 ENCOUNTER — Telehealth: Payer: Self-pay

## 2011-10-15 NOTE — Telephone Encounter (Signed)
Pt is wanting to talk with dr copland about being able to go back to her excersice class she was told to stay out until cardiology appt which we were not able to get until 11/16/11 with Ozora  Please call patient at [phone] dfb

## 2011-10-16 NOTE — Telephone Encounter (Signed)
LMOM to CB. 

## 2011-10-16 NOTE — Telephone Encounter (Signed)
Please get details. If patient was told to not exercise until then, then that is what we will still advise.  Phyllis Shelton

## 2011-10-16 NOTE — Telephone Encounter (Signed)
Spoke with pt advised to wait until she gets cardiac clearance. Pt understood

## 2011-11-15 ENCOUNTER — Encounter: Payer: Self-pay | Admitting: Cardiovascular Disease

## 2011-11-15 ENCOUNTER — Encounter: Payer: Self-pay | Admitting: *Deleted

## 2011-11-16 ENCOUNTER — Encounter: Payer: Self-pay | Admitting: Cardiovascular Disease

## 2011-11-16 ENCOUNTER — Ambulatory Visit (INDEPENDENT_AMBULATORY_CARE_PROVIDER_SITE_OTHER): Payer: Self-pay | Admitting: Cardiovascular Disease

## 2011-11-16 VITALS — BP 120/77 | HR 61 | Ht 59.0 in | Wt 146.0 lb

## 2011-11-16 DIAGNOSIS — R002 Palpitations: Secondary | ICD-10-CM

## 2011-11-16 NOTE — Progress Notes (Signed)
Patient ID: Phyllis Shelton, female   DOB: 1976-05-04, 36 y.o.   MRN: [id number] 36 yo referred by Dr Winn Jock  Has noted "fluttering" in her heart that she has noted for about a month   No chest pain  Describes it as an awareness of her heart- reminds her of when she was pregnant and could feel her baby kick but in the heart area. Palpitations have been occuring a few times a day sometimes more frequently. Sensation lasts about 30 seconds and feels as though her heart is skipping- not racing.   She has started exercising more and has changed her diet since January. She is doing boot camp every week day. She has lost about 12 lbs and is pleased about this! However, she does admit that she had to get up at 4:30 for boot camp and is not getting enough sleep. The palpatations do not seem to occur while she is exercising. She has not noted syncope or any other issues with exercise. She has no changes in her exercise tolerance.   Never had palpitations in the past. No known family history of CAD or other heart problems. No tobacco. She is not taking diet pills. She does drink 2 or 3 cups of coffee daily but this is not a new thing.  TSH and labs done 3/26 reviewed and normal  ROS: Denies fever, malais, weight loss, blurry vision, decreased visual acuity, cough, sputum, SOB, hemoptysis, pleuritic pain, palpitaitons, heartburn, abdominal pain, melena, lower extremity edema, claudication, or rash.  All other systems reviewed and negative   General: Affect appropriate Healthy:  appears stated age HEENT: normal Neck supple with no adenopathy JVP normal no bruits no thyromegaly Lungs clear with no wheezing and good diaphragmatic motion Heart:  S1/S2 no murmur,rub, gallop or click PMI normal Abdomen: benighn, BS positve, no tenderness, no AAA no bruit.  No HSM or HJR Distal pulses intact with no bruits No edema Neuro non-focal Skin warm and dry No muscular weakness  Medications No current outpatient  prescriptions on file.    Allergies Darvocet and Oxycodone-acetaminophen  Family History: Family History  Problem Relation Age of Onset  . Diabetes    . Hypertension    . Hyperlipidemia    . Cancer Paternal Grandfather     Social History: History   Social History  . Marital Status: Married    Spouse Name: N/A    Number of Children: N/A  . Years of Education: N/A   Occupational History  . Not on file.   Social History Main Topics  . Smoking status: Never Smoker   . Smokeless tobacco: Not on file  . Alcohol Use: Not on file  . Drug Use: Not on file  . Sexually Active: Not on file   Other Topics Concern  . Not on file   Social History Narrative  . No narrative on file    Electrocardiogram:  SR rate 47  Short PR but no WPW  Assessment and Plan

## 2011-11-16 NOTE — Assessment & Plan Note (Signed)
Benign sounding but persistant.  F/U stress echo and event monitor.  Ok to go to [employer].  ECG shows short PR but no WPW F/U with me in a month unless stress echo abnormal or proarrhythmic with exercise

## 2011-11-16 NOTE — Patient Instructions (Signed)
Your physician recommends that you schedule a follow-up appointment in: 1 MONTH WITH DR [facility] Your physician recommends that you continue on your current medications as directed. Please refer to the Current Medication list given to you today. Your physician has recommended that you wear an event monitor. Event monitors are medical devices that record the heart's electrical activity. Doctors most often [place] these monitors to diagnose arrhythmias. Arrhythmias are problems with the speed or rhythm of the heartbeat. The monitor is a small, portable device. You can wear one while you do your normal daily activities. This is usually used to diagnose what is causing palpitations/syncope (passing out). 21 DAY EVENT  DX PALPITATIONS Your physician has requested that you have a stress echocardiogram. For further information please visit [email or web address] Please follow instruction sheet as given. DX   PALPITATIONS

## 2011-11-16 NOTE — Progress Notes (Signed)
Addended by: Scherrie Bateman E on: 11/16/2011 09:57 AM   Modules accepted: Orders

## 2011-11-30 ENCOUNTER — Encounter (INDEPENDENT_AMBULATORY_CARE_PROVIDER_SITE_OTHER): Payer: BC Managed Care – PPO

## 2011-11-30 ENCOUNTER — Ambulatory Visit (HOSPITAL_COMMUNITY): Payer: BC Managed Care – PPO | Attending: Cardiology

## 2011-11-30 ENCOUNTER — Encounter: Payer: Self-pay | Admitting: Cardiovascular Disease

## 2011-11-30 ENCOUNTER — Encounter (HOSPITAL_COMMUNITY): Payer: Self-pay | Admitting: *Deleted

## 2011-11-30 DIAGNOSIS — R002 Palpitations: Secondary | ICD-10-CM

## 2011-12-28 ENCOUNTER — Telehealth: Payer: Self-pay | Admitting: *Deleted

## 2011-12-28 NOTE — Telephone Encounter (Signed)
Pt aware of monitor results. She has not had any palpitations recently Mylo Red RN

## 2013-04-11 ENCOUNTER — Encounter: Payer: Self-pay | Admitting: Certified Nurse Midwife

## 2013-04-17 ENCOUNTER — Ambulatory Visit (INDEPENDENT_AMBULATORY_CARE_PROVIDER_SITE_OTHER): Payer: BC Managed Care – PPO | Admitting: Certified Nurse Midwife

## 2013-04-17 ENCOUNTER — Encounter: Payer: Self-pay | Admitting: Certified Nurse Midwife

## 2013-04-17 VITALS — BP 100/64 | HR 64 | Resp 16 | Ht 59.5 in | Wt 154.0 lb

## 2013-04-17 DIAGNOSIS — Z01419 Encounter for gynecological examination (general) (routine) without abnormal findings: Secondary | ICD-10-CM

## 2013-04-17 DIAGNOSIS — Z0000 Encounter for general adult medical examination without abnormal findings: Secondary | ICD-10-CM

## 2013-04-17 LAB — POCT URINALYSIS DIPSTICK
Bilirubin, UA: NEGATIVE
Blood, UA: NEGATIVE
Glucose, UA: NEGATIVE
Ketones, UA: NEGATIVE
Leukocytes, UA: NEGATIVE
Nitrite, UA: NEGATIVE
Protein, UA: NEGATIVE
Urobilinogen, UA: NEGATIVE
pH, UA: 5

## 2013-04-17 LAB — HEMOGLOBIN, FINGERSTICK: Hemoglobin, fingerstick: 14.8 g/dL (ref 12.0–16.0)

## 2013-04-17 NOTE — Progress Notes (Signed)
Note reviewed, agree with plan.  Tracy Lathrop, MD  

## 2013-04-17 NOTE — Progress Notes (Signed)
37 y.o. G41P2002 Married Caucasian Fe here for annual exam.  Periods normal, no issues. Sees PCP prn, had labs last year with PCP and all neg. Per patient. No health issues today. No heart palpitations in the past year after work up with cardiology and was told all negative. No health issues today.   Patient's last menstrual period was 03/23/2013.          Sexually active: yes  The current method of family planning is vasectomy.    Exercising: yes  cardio & strength training Smoker:  no  Health Maintenance: Pap:  04-12-12 neg HPV HR neg MMG:  none Colonoscopy:  none BMD:   none TDaP:  2009 Labs: Poct urine-neg, Hgb-14.8 Self breast exam: done occ   reports that she has never smoked. She does not have any smokeless tobacco history on file. She reports that she does not drink alcohol or use illicit drugs.  Past Medical History  Diagnosis Date  . 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  . Palpitations   . Heart palpitations     neg cardiology work-up    Past Surgical History  Procedure Laterality Date  . Cesarean section    . Wisdom tooth extraction      Current Outpatient Prescriptions  Medication Sig Dispense Refill  . IBUPROFEN PO Take by mouth as needed.       No current facility-administered medications for this visit.    Family History  Problem Relation Age of Onset  . Diabetes    . Hypertension    . Hyperlipidemia    . Cancer Paternal Grandfather     stomach  . Osteoporosis Mother   . Hypertension Father   . Aneurysm Father     ROS:  Pertinent items are noted in HPI.  Otherwise, a comprehensive ROS was negative.  Exam:   Ht 4' 11.5" (1.511 m)  Wt 154 lb (69.854 kg)  BMI 30.6 kg/m2  LMP 03/23/2013 Height: 4' 11.5" (151.1 cm)  Ht Readings from Last 3 Encounters:  04/17/13 4' 11.5" (1.511 m)  11/16/11 4\' 11"  (1.499 m)  10/12/11 4' 11.5" (1.511 m)    General appearance: alert, cooperative and appears stated age Head: Normocephalic, without obvious  abnormality, atraumatic Neck: no adenopathy, supple, symmetrical, trachea midline and thyroid normal to inspection and palpation Lungs: clear to auscultation bilaterally Breasts: normal appearance, no masses or tenderness, No nipple retraction or dimpling, No nipple discharge or bleeding, No axillary or supraclavicular adenopathy Heart: regular rate and rhythm Abdomen: soft, non-tender; no masses,  no organomegaly Extremities: extremities normal, atraumatic, no cyanosis or edema Skin: Skin color, texture, turgor normal. No rashes or lesions Lymph nodes: Cervical, supraclavicular, and axillary nodes normal. No abnormal inguinal nodes palpated Neurologic: Grossly normal   Pelvic: External genitalia:  no lesions              Urethra:  normal appearing urethra with no masses, tenderness or lesions              Bartholin's and Skene's: normal                 Vagina: normal appearing vagina with normal color and discharge, no lesions              Cervix: normal, non tender              Pap taken: no Bimanual Exam:  Uterus:  mid position              Adnexa: normal  adnexa and no mass, fullness, tenderness               Rectovaginal: Confirms               Anus:  normal sphincter tone, no lesions  A:  Well Woman with normal exam  Contraception spouse vasectomy   P:   Reviewed health and wellness pertinent to exam  Pap smear as per guidelines   Mammogram starting at 40, given information to schedule after she turns 38 in March 2015 pap smear not taken today  counseled on breast self exam, mammography screening,   return annually or prn  An After Visit Summary was printed and given to the patient.

## 2013-04-17 NOTE — Patient Instructions (Signed)

## 2013-06-13 ENCOUNTER — Ambulatory Visit: Payer: BC Managed Care – PPO | Admitting: Emergency Medicine

## 2013-06-13 VITALS — BP 110/76 | HR 60 | Temp 99.0°F | Resp 16 | Ht 59.5 in | Wt 151.0 lb

## 2013-06-13 DIAGNOSIS — H109 Unspecified conjunctivitis: Secondary | ICD-10-CM

## 2013-06-13 MED ORDER — OFLOXACIN 0.3 % OP SOLN: 1.0000 [drp] | OPHTHALMIC | Status: DC

## 2013-06-13 NOTE — Progress Notes (Signed)
Subjective:  This chart was scribed for Phyllis Chris, MD by Carl Best, Medical Scribe. This patient was seen in Room 13 and the patient's care was started at 2:39 PM.   Patient ID: Phyllis Shelton, female    DOB: 1976-04-17, 37 y.o.   MRN: [id number]  HPI HPI Comments: Phyllis Shelton is a 37 y.o. female who presents to the [facility] complaining of left eye dacrocystitis that started after she put on eye makeup.  She states that her eyes have been tearing up a lot lately.  The patient denies vision changes as an associated symptom.  She denies using any eyedrops for her symptoms.  She states that she normally wears glasses.  The patient states that she is allergic to pain medication.   Past Medical History  Diagnosis Date  . 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  . Palpitations   . Heart palpitations     neg cardiology work-up   Past Surgical History  Procedure Laterality Date  . Cesarean section    . Wisdom tooth extraction     Family History  Problem Relation Age of Onset  . Cancer Paternal Grandfather     stomach  . Osteoporosis Mother   . Hypertension Father   . Aneurysm Father   . Diabetes Paternal Grandmother    History   Social History  . Marital Status: Married    Spouse Name: N/A    Number of Children: N/A  . Years of Education: N/A   Occupational History  . Not on file.   Social History Main Topics  . Smoking status: Never Smoker   . Smokeless tobacco: Not on file  . Alcohol Use: No  . Drug Use: No  . Sexual Activity: Yes    Partners: Male     Comment: husband vasectomy   Other Topics Concern  . Not on file   Social History Narrative  . No narrative on file   Allergies  Allergen Reactions  . Darvocet [Propoxyphene-Acetaminophen]   . Oxycodone-Acetaminophen    No current outpatient prescriptions on file.   Review of Systems  Eyes: Positive for pain (left eye). Negative for photophobia, discharge, redness,  itching and visual disturbance.  All other systems reviewed and are negative.       Objective:   Physical Exam  Nursing note and vitals reviewed. Constitutional: She is oriented to person, place, and time. She appears well-developed and well-nourished. No distress.  HENT:  Head: Normocephalic and atraumatic.  Right Ear: External ear normal.  Left Ear: External ear normal.  Nose: Nose normal.  Mouth/Throat: Oropharynx is clear and moist. No oropharyngeal exudate.  Eyes: EOM and lids are normal. Pupils are equal, round, and reactive to light.  Neck: Normal range of motion. Neck supple.  Cardiovascular: Normal rate, regular rhythm and normal heart sounds.  Exam reveals no gallop and no friction rub.   No murmur heard. Pulmonary/Chest: Effort normal and breath sounds normal. No respiratory distress. She has no wheezes. She has no rales.  Abdominal: Soft. There is no tenderness.  Musculoskeletal: Normal range of motion.  Neurological: She is alert and oriented to person, place, and time.  Skin: Skin is warm and dry.  Psychiatric: She has a normal mood and affect. Her behavior is normal.   There is some crusting on the lateral portion of. There is tenderness present over the medial left lower lid. Pupils are equal and reactive to light. The the version was negative the cornea  appeared normal on funduscopic exam and the disc itself was also normal     Assessment & Plan:  Patient has evidence of a dacryocystitis of treat with antibiotics, heat, and massage.

## 2013-06-13 NOTE — Patient Instructions (Signed)
Apply heat and massage to the left lower lid 3-4 times a day

## 2013-07-25 ENCOUNTER — Ambulatory Visit (INDEPENDENT_AMBULATORY_CARE_PROVIDER_SITE_OTHER): Payer: BC Managed Care – PPO | Admitting: Family Medicine

## 2013-07-25 ENCOUNTER — Encounter: Payer: Self-pay | Admitting: Family Medicine

## 2013-07-25 VITALS — BP 120/70 | HR 54 | Temp 98.3°F | Ht 59.5 in | Wt 154.0 lb

## 2013-07-25 DIAGNOSIS — J209 Acute bronchitis, unspecified: Secondary | ICD-10-CM

## 2013-07-25 DIAGNOSIS — E669 Obesity, unspecified: Secondary | ICD-10-CM | POA: Insufficient documentation

## 2013-07-25 MED ORDER — AZITHROMYCIN 250 MG PO TABS: ORAL_TABLET | ORAL | Status: DC

## 2013-07-25 NOTE — Progress Notes (Signed)
  Subjective:     Phyllis Shelton is a 38 y.o. female here for evaluation of a cough. Onset of symptoms was 2 weeks ago. Symptoms have been gradually worsening since that time. The cough is productive and is aggravated by reclining position. Associated symptoms include: shortness of breath, sputum production and wheezing. Patient does not have a history of asthma. Patient does not have a history of environmental allergens. Patient has not traveled recently. Patient does not have a history of smoking. Patient has not had a previous chest x-ray. Patient has not had a PPD done.  Pt has tried nyquil 1x  The following portions of the patient's history were reviewed and updated as appropriate: allergies, current medications, past family history, past medical history, past social history, past surgical history and problem list.  Review of Systems Pertinent items are noted in HPI.    Objective:    Oxygen saturation 97% on room air BP 120/70  Pulse 54  Temp(Src) 98.3 F (36.8 C) (Oral)  Ht 4' 11.5" (1.511 m)  Wt 154 lb (69.854 kg)  BMI 30.60 kg/m2  SpO2 97%  LMP 06/18/2013 General appearance: alert, cooperative, appears stated age and no distress Ears: normal TM's and external ear canals both ears Nose: yellow discharge, mild congestion, no sinus tenderness Throat: abnormal findings: mild oropharyngeal erythema Neck: no adenopathy, supple, symmetrical, trachea midline and thyroid not enlarged, symmetric, no tenderness/mass/nodules Lungs: diminished breath sounds bilaterally Heart: S1, S2 normal    Assessment:    Acute Bronchitis    Plan:    Antibiotics per medication orders. Avoid exposure to tobacco smoke and fumes. Call if shortness of breath worsens, blood in sputum, change in character of cough, development of fever or chills, inability to maintain nutrition and hydration. Avoid exposure to tobacco smoke and fumes. otc cough meds

## 2013-07-25 NOTE — Progress Notes (Signed)
Pre visit review using our clinic review tool, if applicable. No additional management support is needed unless otherwise documented below in the visit note. 

## 2013-07-25 NOTE — Patient Instructions (Signed)

## 2013-08-15 ENCOUNTER — Ambulatory Visit (INDEPENDENT_AMBULATORY_CARE_PROVIDER_SITE_OTHER): Payer: BC Managed Care – PPO | Admitting: Family Medicine

## 2013-08-15 ENCOUNTER — Encounter: Payer: Self-pay | Admitting: Family Medicine

## 2013-08-15 VITALS — BP 110/72 | HR 64 | Temp 98.7°F | Resp 16 | Wt 153.5 lb

## 2013-08-15 DIAGNOSIS — J209 Acute bronchitis, unspecified: Secondary | ICD-10-CM

## 2013-08-15 MED ORDER — PROMETHAZINE-DM 6.25-15 MG/5ML PO SYRP: 5.0000 mL | ORAL_SOLUTION | Freq: Four times a day (QID) | ORAL | Status: DC | PRN

## 2013-08-15 NOTE — Progress Notes (Signed)
Pre visit review using our clinic review tool, if applicable. No additional management support is needed unless otherwise documented below in the visit note. 

## 2013-08-15 NOTE — Patient Instructions (Signed)
Follow up as needed Start the cough syrup as needed- will cause drowsiness Mucinex DM for daytime cough Drink plenty of fluids REST! Hang in there!!

## 2013-08-15 NOTE — Progress Notes (Signed)
   Subjective:    Patient ID: Phyllis Shelton, female    DOB: Apr 22, 1976, 38 y.o.   MRN: [id number]  HPI URI- [place] and [place].  A few days later, developed cough.  No fever.  + PND.  Cough is productive.  + chest tightness.  Rare wheeze.  No sinus pain/pressues.  No ear pain.   Review of Systems For ROS see HPI    Objective:   Physical Exam  Vitals reviewed. Constitutional: She appears well-developed and well-nourished. No distress.  HENT:  Head: Normocephalic and atraumatic.  TMs normal bilaterally Mild nasal congestion Throat w/out erythema, edema, or exudate  Eyes: Conjunctivae and EOM are normal. Pupils are equal, round, and reactive to light.  Neck: Normal range of motion. Neck supple.  Cardiovascular: Normal rate, regular rhythm, normal heart sounds and intact distal pulses.   No murmur heard. Pulmonary/Chest: Effort normal and breath sounds normal. No respiratory distress. She has no wheezes.  + hacking cough  Lymphadenopathy:    She has no cervical adenopathy.          Assessment & Plan:

## 2013-08-15 NOTE — Assessment & Plan Note (Signed)
New.  Suspect this is viral as pt has no evidence of bacterial infxn or PNA.  Start cough meds prn.  Reviewed supportive care and red flags that should prompt return.  Pt expressed understanding and is in agreement w/ plan.

## 2013-10-02 ENCOUNTER — Ambulatory Visit (INDEPENDENT_AMBULATORY_CARE_PROVIDER_SITE_OTHER): Payer: BC Managed Care – PPO | Admitting: Family Medicine

## 2013-10-02 ENCOUNTER — Encounter: Payer: Self-pay | Admitting: Family Medicine

## 2013-10-02 VITALS — BP 114/78 | HR 62 | Temp 98.2°F | Resp 17 | Wt 145.1 lb

## 2013-10-02 DIAGNOSIS — B353 Tinea pedis: Secondary | ICD-10-CM

## 2013-10-02 MED ORDER — CLOTRIMAZOLE-BETAMETHASONE 1-0.05 % EX CREA: 1.0000 "application " | TOPICAL_CREAM | Freq: Two times a day (BID) | CUTANEOUS | Status: DC

## 2013-10-02 NOTE — Progress Notes (Signed)
Pre visit review using our clinic review tool, if applicable. No additional management support is needed unless otherwise documented below in the visit note. 

## 2013-10-02 NOTE — Patient Instructions (Signed)
Follow up as needed Start the Lotrisone cream twice daily Try and keep foot clean and dry Call if no improvement or worsening Happy [place] Patrick's Day!!

## 2013-10-02 NOTE — Progress Notes (Signed)
   Subjective:    Patient ID: Phyllis Shelton, female    DOB: 06-Dec-1975, 38 y.o.   MRN: [id number]  HPI Rash- sxs started 'years and years ago' localized to bottom of R foot between toes.  Worse w/ sweat, heat.  + itching.  Not responding to OTC athlete's foot creams.   Review of Systems For ROS see HPI     Objective:   Physical Exam  Vitals reviewed. Constitutional: She appears well-developed and well-nourished. No distress.  Cardiovascular: Intact distal pulses.   Skin: Skin is warm and dry. Rash (dry, peeling skin between toes on R foot) noted.          Assessment & Plan:

## 2013-10-02 NOTE — Assessment & Plan Note (Signed)
New.  Start combination anti-fungal/steroid cream to treat both the cause and the symptoms.  Reviewed supportive care and red flags that should prompt return.  Pt expressed understanding and is in agreement w/ plan.

## 2014-04-18 ENCOUNTER — Ambulatory Visit: Payer: BC Managed Care – PPO | Admitting: Certified Nurse Midwife

## 2014-05-07 ENCOUNTER — Encounter: Payer: Self-pay | Admitting: Certified Nurse Midwife

## 2014-05-07 ENCOUNTER — Ambulatory Visit (INDEPENDENT_AMBULATORY_CARE_PROVIDER_SITE_OTHER): Payer: BC Managed Care – PPO | Admitting: Certified Nurse Midwife

## 2014-05-07 VITALS — BP 102/74 | HR 68 | Resp 16 | Ht 59.5 in | Wt 146.0 lb

## 2014-05-07 DIAGNOSIS — Z124 Encounter for screening for malignant neoplasm of cervix: Secondary | ICD-10-CM

## 2014-05-07 DIAGNOSIS — Z01419 Encounter for gynecological examination (general) (routine) without abnormal findings: Secondary | ICD-10-CM

## 2014-05-07 DIAGNOSIS — Z0000 Encounter for general adult medical examination without abnormal findings: Secondary | ICD-10-CM

## 2014-05-07 LAB — HEMOGLOBIN, FINGERSTICK: Hemoglobin, fingerstick: 14.9 g/dL (ref 12.0–16.0)

## 2014-05-07 NOTE — Progress Notes (Signed)
38 y.o. G58P2002 Married Caucasian Fe here for annual exam. Periods normal, no issues. Employment is changing will be eliminated from position. Sees PCP prn. Has mole she would like checked. No other health issues today.  Patient's last menstrual period was 04/11/2014.          Sexually active: Yes.    The current method of family planning is vasectomy.    Exercising: Yes.    cardio & strength training Smoker:  no  Health Maintenance: Pap:  last pap 04-12-12 neg HPV HR neg MMG:has information to schedule Colonoscopy:  none BMD:   none TDaP:  2011 Labs: Hgb-14.9 Self breast exam: done occ   reports that she has never smoked. She does not have any smokeless tobacco history on file. She reports that she drinks alcohol. She reports that she does not use illicit drugs.  Past Medical History  Diagnosis Date  . 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  . Palpitations   . Heart palpitations     neg cardiology work-up    Past Surgical History  Procedure Laterality Date  . Cesarean section    . Wisdom tooth extraction      No current outpatient prescriptions on file.   No current facility-administered medications for this visit.    Family History  Problem Relation Age of Onset  . Cancer Paternal Grandfather     stomach  . Osteoporosis Mother     diagnosed at age 3  . Cancer Mother     thyroid  . Hypertension Father   . Aneurysm Father   . Alcohol abuse Father   . Diabetes Paternal Grandmother   . Dementia Maternal Grandmother     ROS:  Pertinent items are noted in HPI.  Otherwise, a comprehensive ROS was negative.  Exam:   BP 102/74  Pulse 68  Resp 16  Ht 4' 11.5" (1.511 m)  Wt 146 lb (66.225 kg)  BMI 29.01 kg/m2  LMP 04/11/2014 Height: 4' 11.5" (151.1 cm)  Ht Readings from Last 3 Encounters:  05/07/14 4' 11.5" (1.511 m)  07/25/13 4' 11.5" (1.511 m)  06/13/13 4' 11.5" (1.511 m)    General appearance: alert, cooperative and appears stated age Head:  Normocephalic, without obvious abnormality, atraumatic Neck: no adenopathy, supple, symmetrical, trachea midline and thyroid normal to inspection and palpation Lungs: clear to auscultation bilaterally Breasts: normal appearance, no masses or tenderness, No nipple retraction or dimpling, No nipple discharge or bleeding, No axillary or supraclavicular adenopathy Heart: regular rate and rhythm Abdomen: soft, non-tender; no masses,  no organomegaly Extremities: extremities normal, atraumatic, no cyanosis or edema small pigmented mole pea size on upper right arm,round not irregular in shape, but raised, no redness Skin: Skin color, texture, turgor normal. No rashes or lesions Lymph nodes: Cervical, supraclavicular, and axillary nodes normal. No abnormal inguinal nodes palpated Neurologic: Grossly normal   Pelvic: External genitalia:  no lesions              Urethra:  normal appearing urethra with no masses, tenderness or lesions              Bartholin's and Skene's: normal                 Vagina: normal appearing vagina with normal color and discharge, no lesions              Cervix: normal,non tender              Pap taken: Yes.   Bimanual Exam:  Uterus:  normal size, contour, position, consistency, mobility, non-tender and anteverted              Adnexa: normal adnexa and no mass, fullness, tenderness               Rectovaginal: Confirms               Anus:  normal sphincter tone, no lesions  A:  Well Woman with normal exam  Pigmented mole on upper right arm  Social stress with job loss    P:   Reviewed health and wellness pertinent to exam  Discussed should be evaluated by dermatology along with overall skin check. Suggested [facility], patient will call.  Discussed family and friend support and networking for other employment.  Pap smear taken today with HPV reflex. Given HPV information regarding Gardasil, her request for family member.   counseled on breast self exam,  mammography screening, adequate intake of calcium and vitamin D, diet and exercise  return annually or prn  An After Visit Summary was printed and given to the patient.

## 2014-05-07 NOTE — Patient Instructions (Signed)
EXERCISE AND DIET:  We recommended that you start or continue a regular exercise program for good health. Regular exercise means any activity that makes your heart beat faster and makes you sweat.  We recommend exercising at least 30 minutes per day at least 3 days a week, preferably 4 or 5.  We also recommend a diet low in fat and sugar.  Inactivity, poor dietary choices and obesity can cause diabetes, heart attack, stroke, and kidney damage, among others.    ALCOHOL AND SMOKING:  Women should limit their alcohol intake to no more than 7 drinks/beers/glasses of wine (combined, not each!) per week. Moderation of alcohol intake to this level decreases your risk of breast cancer and liver damage. And of course, no recreational drugs are part of a healthy lifestyle.  And absolutely no smoking or even second hand smoke. Most people know smoking can cause heart and lung diseases, but did you know it also contributes to weakening of your bones? Aging of your skin?  Yellowing of your teeth and nails?  CALCIUM AND VITAMIN D:  Adequate intake of calcium and Vitamin D are recommended.  The recommendations for exact amounts of these supplements seem to change often, but generally speaking 600 mg of calcium (either carbonate or citrate) and 800 units of Vitamin D per day seems prudent. Certain women may benefit from higher intake of Vitamin D.  If you are among these women, your doctor will have told you during your visit.    PAP SMEARS:  Pap smears, to check for cervical cancer or precancers,  have traditionally been done yearly, although recent scientific advances have shown that most women can have pap smears less often.  However, every woman still should have a physical exam from her gynecologist every year. It will include a breast check, inspection of the vulva and vagina to check for abnormal growths or skin changes, a visual exam of the cervix, and then an exam to evaluate the size and shape of the uterus and  ovaries.  And after 40 years of age, a rectal exam is indicated to check for rectal cancers. We will also provide age appropriate advice regarding health maintenance, like when you should have certain vaccines, screening for sexually transmitted diseases, bone density testing, colonoscopy, mammograms, etc.   MAMMOGRAMS:  All women over 40 years old should have a yearly mammogram. Many facilities now offer a "3D" mammogram, which may cost around $50 extra out of pocket. If possible,  we recommend you accept the option to have the 3D mammogram performed.  It both reduces the number of women who will be called back for extra views which then turn out to be normal, and it is better than the routine mammogram at detecting truly abnormal areas.    COLONOSCOPY:  Colonoscopy to screen for colon cancer is recommended for all women at age 50.  We know, you hate the idea of the prep.  We agree, BUT, having colon cancer and not knowing it is worse!!  Colon cancer so often starts as a polyp that can be seen and removed at colonscopy, which can quite literally save your life!  And if your first colonoscopy is normal and you have no family history of colon cancer, most women don't have to have it again for 10 years.  Once every ten years, you can do something that may end up saving your life, right?  We will be happy to help you get it scheduled when you are ready.    Be sure to check your insurance coverage so you understand how much it will cost.  It may be covered as a preventative service at no cost, but you should check your particular policy.     Good luck with your next employment!!! Have a great year Phyllis Shelton  HPV Test The HPV (human papillomavirus) test is used to screen for high-risk types with HPV infection. HPV is a group of about 100 related viruses, of which 40 types are genital viruses. Most HPV viruses cause infections that usually resolve without treatment within 2 years. Some HPV infections can cause skin  and genital warts (condylomata). HPV types 16, 18, 31 and 45 are considered high-risk types of HPV. High-risk types of HPV do not usually cause visible warts, but if untreated, may lead to cancers of the outlet of the womb (cervix) or anus. An HPV test identifies the DNA (genetic) strands of the HPV infection. Because the test identifies the DNA strands, the test is also referred to as the HPV DNA test. Although HPV is found in both males and females, the HPV test is only used to screen for cervical cancer in females. This test is recommended for females:  With an abnormal Pap test.  After treatment of an abnormal Pap test.  Aged 5 and older.  After treatment of a high-risk HPV infection. The HPV test may be done at the same time as a Pap test in females over the age of 65. Both the HPV and Pap test require a sample of cells from the cervix. PREPARATION FOR TEST  You may be asked to avoid douching, tampons, or vaginal medicines for 48 hours before the HPV test. You will be asked to urinate before the test. For the HPV test, you will need to lie on an exam table with your feet in stirrups. A spatula will be inserted into the vagina. The spatula will be used to swab the cervix for a cell and mucus sample. The sample will be further evaluated in a lab under a microscope. NORMAL FINDINGS  Normal: High-risk HPV is not found.  Ranges for normal findings may vary among different laboratories and hospitals. You should always check with your doctor after having lab work or other tests done to discuss the meaning of your test results and whether your values are considered within normal limits. MEANING OF TEST An abnormal HPV test means that high-risk HPV is found. Your caregiver may recommend further testing. Your caregiver will go over the test results with you. He or she will and discuss the importance and meaning of your results, as well as treatment options and the need for additional tests, if  necessary. OBTAINING THE RESULTS  It is your responsibility to obtain your test results. Ask the lab or department performing the test when and how you will get your results. Document Released: 07/30/2004 Document Revised: 09/27/2011 Document Reviewed: 04/14/2005 [facility] Patient Information 2015 [place], [place]. This information is not intended to replace advice given to you by your health care provider. Make sure you discuss any questions you have with your health care provider.

## 2014-05-08 LAB — IPS PAP TEST WITH REFLEX TO HPV

## 2014-05-09 ENCOUNTER — Other Ambulatory Visit: Payer: Self-pay

## 2014-05-09 DIAGNOSIS — Z1231 Encounter for screening mammogram for malignant neoplasm of breast: Secondary | ICD-10-CM

## 2014-05-10 NOTE — Progress Notes (Signed)
Reviewed personally.  M. Suzanne Miller, MD.  

## 2014-05-15 ENCOUNTER — Ambulatory Visit
Admission: RE | Admit: 2014-05-15 | Discharge: 2014-05-15 | Disposition: A | Discharge status: Home / Self Care | Payer: BC Managed Care – PPO | Source: Ambulatory Visit

## 2014-05-15 DIAGNOSIS — Z1231 Encounter for screening mammogram for malignant neoplasm of breast: Secondary | ICD-10-CM

## 2014-05-20 ENCOUNTER — Encounter: Payer: Self-pay | Admitting: Certified Nurse Midwife

## 2014-10-26 ENCOUNTER — Ambulatory Visit (INDEPENDENT_AMBULATORY_CARE_PROVIDER_SITE_OTHER): Payer: BLUE CROSS/BLUE SHIELD | Admitting: Physician Assistant

## 2014-10-26 VITALS — BP 120/70 | HR 82 | Temp 98.5°F | Ht 60.0 in | Wt 162.4 lb

## 2014-10-26 DIAGNOSIS — R05 Cough: Secondary | ICD-10-CM

## 2014-10-26 DIAGNOSIS — J209 Acute bronchitis, unspecified: Secondary | ICD-10-CM | POA: Diagnosis not present

## 2014-10-26 LAB — POCT CBC
Granulocyte percent: 64.8 %G (ref 37–80)
HCT, POC: 42.1 % (ref 37.7–47.9)
Hemoglobin: 14.1 g/dL (ref 12.2–16.2)
Lymph, poc: 2.4 (ref 0.6–3.4)
MCH, POC: 29.9 pg (ref 27–31.2)
MCHC: 33.5 g/dL (ref 31.8–35.4)
MCV: 89.3 fL (ref 80–97)
MID (cbc): 0.8 (ref 0–0.9)
MPV: 8.8 fL (ref 0–99.8)
POC Granulocyte: 5.8 (ref 2–6.9)
POC LYMPH PERCENT: 26.2 %L (ref 10–50)
POC MID %: 9 %M (ref 0–12)
Platelet Count, POC: 215 10*3/uL (ref 142–424)
RBC: 4.72 M/uL (ref 4.04–5.48)
RDW, POC: 12.9 %
WBC: 9 10*3/uL (ref 4.6–10.2)

## 2014-10-26 MED ORDER — HYDROCOD POLST-CHLORPHEN POLST 10-8 MG/5ML PO LQCR: 5.0000 mL | Freq: Two times a day (BID) | ORAL | Status: DC | PRN

## 2014-10-26 MED ORDER — ALBUTEROL SULFATE HFA 108 (90 BASE) MCG/ACT IN AERS: 2.0000 | INHALATION_SPRAY | RESPIRATORY_TRACT | Status: DC | PRN

## 2014-10-26 MED ORDER — BECLOMETHASONE DIPROPIONATE 40 MCG/ACT IN AERS: 1.0000 | INHALATION_SPRAY | Freq: Two times a day (BID) | RESPIRATORY_TRACT | Status: DC

## 2014-10-26 NOTE — Progress Notes (Signed)
Subjective:    Patient ID: Phyllis Shelton, female    DOB: June 05, 1976, 39 y.o.   MRN: [id number]  HPI  This is a 39 year old female who is presenting with cough x 1 week. Cough was initially dry but became productive yesterday. Sputum is clear. She has had some chills but no fever. Has felt SOB when coughing. No wheezing. Having some CP with coughing. No history of asthma and she is not a smoker. Has tried mucinex DM and nyquil with minimal relief. Denies fever, chills, nasal congestion, sore throat.  Review of Systems  Constitutional: Negative for fever and chills.  HENT: Negative for congestion, ear pain, sinus pressure and sore throat.   Eyes: Negative for redness.  Respiratory: Positive for cough and shortness of breath. Negative for wheezing.   Cardiovascular: Positive for chest pain.  Gastrointestinal: Negative for nausea, vomiting and abdominal pain.  Skin: Negative for rash.  Allergic/Immunologic: Negative for environmental allergies.  Neurological: Negative for headaches.  Psychiatric/Behavioral: Positive for sleep disturbance.    Patient Active Problem List   Diagnosis Date Noted  . Athlete's foot on right 10/02/2013  . Bronchitis with bronchospasm 08/15/2013  . Obesity (BMI 30-39.9) 07/25/2013  . Palpitations 11/16/2011  . 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   Prior to Admission medications   Medication Sig Start Date End Date Taking? Authorizing Provider  dextromethorphan-guaiFENesin (MUCINEX DM) 30-600 MG per 12 hr tablet Take 1 tablet by mouth 2 (two) times daily as needed for cough.   Yes Historical Provider, MD  Pseudoeph-Doxylamine-DM-APAP (NYQUIL PO) Take by mouth.   Yes Historical Provider, MD   Allergies  Allergen Reactions  . Darvocet [Propoxyphene N-Acetaminophen]    Patient's social and family history were reviewed.     Objective:   Physical Exam  Constitutional: She is oriented to person, place, and time. She appears well-developed and  well-nourished. No distress.  HENT:  Head: Normocephalic and atraumatic.  Right Ear: Hearing, tympanic membrane, external ear and ear canal normal.  Left Ear: Hearing, tympanic membrane, external ear and ear canal normal.  Nose: Nose normal.  Mouth/Throat: Uvula is midline, oropharynx is clear and moist and mucous membranes are normal.  Eyes: Conjunctivae and lids are normal. Right eye exhibits no discharge. Left eye exhibits no discharge. No scleral icterus.  Cardiovascular: Normal rate, regular rhythm, normal heart sounds and normal pulses.   No murmur heard. Pulmonary/Chest: Effort normal and breath sounds normal. No respiratory distress. She has no wheezes. She has no rhonchi. She has no rales.  Musculoskeletal: Normal range of motion.  Lymphadenopathy:       Head (right side): No submental, no submandibular and no tonsillar adenopathy present.       Head (left side): No submental, no submandibular and no tonsillar adenopathy present.    She has cervical adenopathy (bilateral anterior).  Neurological: She is alert and oriented to person, place, and time.  Skin: Skin is warm, dry and intact. No lesion and no rash noted.  Psychiatric: She has a normal mood and affect. Her speech is normal and behavior is normal. Thought content normal.   BP 120/70 mmHg  Pulse 82  Temp(Src) 98.5 F (36.9 C) (Oral)  Ht 5' (1.524 m)  Wt 162 lb 6 oz (73.653 kg)  BMI 31.71 kg/m2  SpO2 98%  Results for orders placed or performed in visit on 10/26/14  POCT CBC  Result Value Ref Range   WBC 9.0 4.6 - 10.2 K/uL   Lymph, poc 2.4 0.6 -  3.4   POC LYMPH PERCENT 26.2 10 - 50 %L   MID (cbc) 0.8 0 - 0.9   POC MID % 9.0 0 - 12 %M   POC Granulocyte 5.8 2 - 6.9   Granulocyte percent 64.8 37 - 80 %G   RBC 4.72 4.04 - 5.48 M/uL   Hemoglobin 14.1 12.2 - 16.2 g/dL   HCT, POC 42.1 37.7 - 47.9 %   MCV 89.3 80 - 97 fL   MCH, POC 29.9 27 - 31.2 pg   MCHC 33.5 31.8 - 35.4 g/dL   RDW, POC 12.9 %   Platelet Count,  POC 215 142 - 424 K/uL   MPV 8.8 0 - 99.8 fL      Assessment & Plan:  1. Productive cough 2. Acute bronchitis, unspecified organism CBC wnl. She will continue home mucinex. qvar and albuterol prescribed. Tussionex for nighttime symptoms. She will return in 10-14 days if symptoms are not improving.  - POCT CBC - beclomethasone (QVAR) 40 MCG/ACT inhaler; Inhale 1 puff into the lungs 2 (two) times daily.  Dispense: 1 Inhaler; Refill: 0 - chlorpheniramine-HYDROcodone (TUSSIONEX PENNKINETIC ER) 10-8 MG/5ML LQCR; Take 5 mLs by mouth every 12 (twelve) hours as needed for cough (cough).  Dispense: 80 mL; Refill: 0 - albuterol (PROVENTIL HFA;VENTOLIN HFA) 108 (90 BASE) MCG/ACT inhaler; Inhale 2 puffs into the lungs every 4 (four) hours as needed for wheezing or shortness of breath (cough, shortness of breath or wheezing.).  Dispense: 1 Inhaler; Refill: 0   Benjaman Pott. Drenda Freeze, [facility]  10/26/2014

## 2014-10-26 NOTE — Patient Instructions (Signed)
Use qvar inhaler twice. Rinse mouth out afterwards. Use albuterol inhaler every 4 hours as needed for SOB, chest heaviness, coughing fits Cough syrup at night for sleep. Continue with home mucinex. Drink plenty of water. Return in 7-10 days if your cough does not improve.

## 2014-10-31 ENCOUNTER — Telehealth: Payer: Self-pay | Admitting: Certified Nurse Midwife

## 2014-10-31 NOTE — Telephone Encounter (Signed)
Provider cx. Left message to cb and rs. Recall entered.

## 2014-11-09 ENCOUNTER — Telehealth: Payer: Self-pay

## 2014-11-09 DIAGNOSIS — R059 Cough, unspecified: Secondary | ICD-10-CM

## 2014-11-09 DIAGNOSIS — J209 Acute bronchitis, unspecified: Secondary | ICD-10-CM

## 2014-11-09 DIAGNOSIS — R05 Cough: Secondary | ICD-10-CM

## 2014-11-09 NOTE — Telephone Encounter (Signed)
Cough is mostly at night. No other sx's, except drainage. She would like to know if we can call her in something for cough.

## 2014-11-09 NOTE — Telephone Encounter (Signed)
Patient stated she seen Phyllis Shelton two weeks ago. Patient is still coughing during the night. Patient is requesting a medication for the cough. [facility] on [address] [phone]

## 2014-11-10 MED ORDER — HYDROCODONE-HOMATROPINE 5-1.5 MG/5ML PO SYRP: 5.0000 mL | ORAL_SOLUTION | Freq: Every evening | ORAL | Status: DC | PRN

## 2014-11-10 NOTE — Telephone Encounter (Signed)
PA-Nicole recommended she return to clinic for re-evaluation if her cough persisted. Please advise her to return to make sure she gets evaluated. In the meantime I will send her a rx for Hycodan to help her with cough until she returns to our clinic.  Thank you!

## 2014-11-11 MED ORDER — HYDROCODONE-HOMATROPINE 5-1.5 MG/5ML PO SYRP: 5.0000 mL | ORAL_SOLUTION | Freq: Every evening | ORAL | Status: DC | PRN

## 2014-11-11 NOTE — Telephone Encounter (Signed)
Please re-prescribe hycodan.

## 2014-11-11 NOTE — Telephone Encounter (Signed)
Rx printed

## 2014-11-11 NOTE — Telephone Encounter (Signed)
Pt notified that she can come get rx. She will make an appt to be seen.

## 2015-04-02 ENCOUNTER — Ambulatory Visit (INDEPENDENT_AMBULATORY_CARE_PROVIDER_SITE_OTHER): Payer: BLUE CROSS/BLUE SHIELD | Admitting: Certified Nurse Midwife

## 2015-04-02 ENCOUNTER — Encounter: Payer: Self-pay | Admitting: Certified Nurse Midwife

## 2015-04-02 VITALS — BP 110/72 | HR 70 | Resp 16 | Ht 59.5 in | Wt 164.0 lb

## 2015-04-02 DIAGNOSIS — N762 Acute vulvitis: Secondary | ICD-10-CM

## 2015-04-02 DIAGNOSIS — Z01419 Encounter for gynecological examination (general) (routine) without abnormal findings: Secondary | ICD-10-CM

## 2015-04-02 NOTE — Progress Notes (Signed)
Reviewed personally.  M. Suzanne Miller, MD.  

## 2015-04-02 NOTE — Patient Instructions (Signed)

## 2015-04-02 NOTE — Progress Notes (Signed)
39 y.o. G48P2002 Married  Caucasian Fe here for annual exam. Periods normal,no issues. Patient aware she has gained weight with job loss and staying at home. Has resumed good diet and exercise. Seen urgent care for bronchitis and resolved with problems. Complaining of slight irritation on external and internal vaginal area for the past 2 weeks. No new personal products. Uses pads for menses and no change in type. Denies increase in vaginal discharge or pain. No other health issues today.  Patient's last menstrual period was 03/22/2015.          Sexually active: Yes.    The current method of family planning is vasectomy.    Exercising: Yes.    weight lifting, running Smoker:  no  Health Maintenance: Pap: 05-07-14 neg MMG:  05-15-14 category b density,birads 1:neg Colonoscopy: none BMD:   none TDaP:  2011 Labs: none Self breast exam: done occ   reports that she has never smoked. She does not have any smokeless tobacco history on file. She reports that she drinks alcohol. She reports that she does not use illicit drugs.  Past Medical History  Diagnosis Date  . 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  . Palpitations   . Heart palpitations     neg cardiology work-up    Past Surgical History  Procedure Laterality Date  . Cesarean section    . Wisdom tooth extraction      No current outpatient prescriptions on file.   No current facility-administered medications for this visit.    Family History  Problem Relation Age of Onset  . Cancer Paternal Grandfather     stomach  . Osteoporosis Mother     diagnosed at age 54  . Cancer Mother     thyroid  . Hypertension Father   . Aneurysm Father   . Alcohol abuse Father   . Diabetes Paternal Grandmother   . Dementia Maternal Grandmother     ROS:  Pertinent items are noted in HPI.  Otherwise, a comprehensive ROS was negative.  Exam:   BP 110/72 mmHg  Pulse 70  Resp 16  Ht 4' 11.5" (1.511 m)  Wt 164 lb (74.39 kg)  BMI 32.58  kg/m2  LMP 03/22/2015 Height: 4' 11.5" (151.1 cm) Ht Readings from Last 3 Encounters:  04/02/15 4' 11.5" (1.511 m)  10/26/14 5' (1.524 m)  05/07/14 4' 11.5" (1.511 m)    General appearance: alert, cooperative and appears stated age Head: Normocephalic, without obvious abnormality, atraumatic Neck: no adenopathy, supple, symmetrical, trachea midline and thyroid normal to inspection and palpation Lungs: clear to auscultation bilaterally Breasts: normal appearance, no masses or tenderness, No nipple retraction or dimpling, No nipple discharge or bleeding, No axillary or supraclavicular adenopathy Heart: regular rate and rhythm Abdomen: soft, non-tender; no masses,  no organomegaly Extremities: extremities normal, atraumatic, no cyanosis or edema Skin: Skin color, texture, turgor normal. No rashes or lesions Lymph nodes: Cervical, supraclavicular, and axillary nodes normal. No abnormal inguinal nodes palpated Neurologic: Grossly normal   Pelvic: External genitalia:  no lesions, slight increase in pink inside vulva affirm taken              Urethra:  normal appearing urethra with no masses, tenderness or lesions              Bartholin's and Skene's: normal                 Vagina: normal appearing vagina with normal color and  White discharge, no lesions affirm taken  Cervix: normal, non tender, no lesions              Pap taken: No. Bimanual Exam:  Uterus:  normal size, contour, position, consistency, mobility, non-tender              Adnexa: normal adnexa and no mass, fullness, tenderness               Rectovaginal: Confirms               Anus:  normal sphincter tone, no lesions  Chaperone present: yes  A:  Well Woman with normal exam  Contraception spouse vasectomy  Vulvitis  P:   Reviewed health and wellness pertinent to exam  Reviewed findings of vulvitis and discussed aveeno sitz bath for comfort. Will treat per affirm once results in if indicated.  Pap smear as  above not taken   counseled on breast self exam, mammography screening, STD prevention, adequate intake of calcium and vitamin D, diet and exercise  return annually or prn  An After Visit Summary was printed and given to the patient.

## 2015-04-03 ENCOUNTER — Other Ambulatory Visit: Payer: Self-pay | Admitting: Certified Nurse Midwife

## 2015-04-03 DIAGNOSIS — B9689 Other specified bacterial agents as the cause of diseases classified elsewhere: Secondary | ICD-10-CM

## 2015-04-03 DIAGNOSIS — N760 Acute vaginitis: Secondary | ICD-10-CM

## 2015-04-03 LAB — WET PREP BY MOLECULAR PROBE
Candida species: NEGATIVE
Gardnerella vaginalis: POSITIVE — AB
Trichomonas vaginosis: NEGATIVE

## 2015-04-03 MED ORDER — HYLAFEM VA SUPP: 1.0000 | Freq: Every day | VAGINAL | Status: DC

## 2015-04-19 LAB — HM PAP SMEAR

## 2015-05-12 ENCOUNTER — Ambulatory Visit: Payer: BC Managed Care – PPO | Admitting: Certified Nurse Midwife

## 2015-07-18 ENCOUNTER — Other Ambulatory Visit: Payer: Self-pay

## 2015-07-18 DIAGNOSIS — Z1231 Encounter for screening mammogram for malignant neoplasm of breast: Secondary | ICD-10-CM

## 2015-07-31 ENCOUNTER — Ambulatory Visit
Admission: RE | Admit: 2015-07-31 | Discharge: 2015-07-31 | Disposition: A | Discharge status: Home / Self Care | Payer: BLUE CROSS/BLUE SHIELD | Source: Ambulatory Visit

## 2015-07-31 DIAGNOSIS — Z1231 Encounter for screening mammogram for malignant neoplasm of breast: Secondary | ICD-10-CM

## 2015-08-07 ENCOUNTER — Telehealth: Payer: Self-pay | Admitting: Certified Nurse Midwife

## 2015-08-07 NOTE — Telephone Encounter (Signed)
Left message on voicemail to call and reschedule cancelled appointment. °

## 2015-11-12 ENCOUNTER — Ambulatory Visit (INDEPENDENT_AMBULATORY_CARE_PROVIDER_SITE_OTHER): Payer: BLUE CROSS/BLUE SHIELD | Admitting: Family Medicine

## 2015-11-12 ENCOUNTER — Encounter: Payer: Self-pay | Admitting: Family Medicine

## 2015-11-12 VITALS — BP 110/72 | HR 82 | Temp 98.1°F | Resp 16 | Ht 60.0 in | Wt 165.4 lb

## 2015-11-12 DIAGNOSIS — Z0000 Encounter for general adult medical examination without abnormal findings: Secondary | ICD-10-CM

## 2015-11-12 LAB — LIPID PANEL
Cholesterol: 184 mg/dL (ref 125–200)
HDL: 59 mg/dL (ref >=46)
LDL Cholesterol: 103 mg/dL (ref <130)
Total CHOL/HDL Ratio: 3.1 Ratio (ref <=5.0)
Triglycerides: 110 mg/dL (ref <150)
VLDL: 22 mg/dL (ref <30)

## 2015-11-12 LAB — CBC WITH DIFFERENTIAL/PLATELET
Basophils Absolute: 103 cells/uL (ref 0–200)
Basophils Relative: 1 %
Eosinophils Absolute: 206 cells/uL (ref 15–500)
Eosinophils Relative: 2 %
HCT: 41.5 % (ref 35.0–45.0)
Hemoglobin: 14.4 g/dL (ref 11.7–15.5)
Lymphocytes Relative: 27 %
Lymphs Abs: 2781 cells/uL (ref 850–3900)
MCH: 30.4 pg (ref 27.0–33.0)
MCHC: 34.7 g/dL (ref 32.0–36.0)
MCV: 87.7 fL (ref 80.0–100.0)
MPV: 11 fL (ref 7.5–12.5)
Monocytes Absolute: 1030 cells/uL — ABNORMAL HIGH (ref 200–950)
Monocytes Relative: 10 %
Neutro Abs: 6180 cells/uL (ref 1500–7800)
Neutrophils Relative %: 60 %
Platelets: 264 10*3/uL (ref 140–400)
RBC: 4.73 MIL/uL (ref 3.80–5.10)
RDW: 13.1 % (ref 11.0–15.0)
WBC: 10.3 10*3/uL (ref 3.8–10.8)

## 2015-11-12 LAB — HEPATIC FUNCTION PANEL
ALT: 17 U/L (ref 6–29)
AST: 20 U/L (ref 10–30)
Albumin: 4.5 g/dL (ref 3.6–5.1)
Alkaline Phosphatase: 90 U/L (ref 33–115)
Bilirubin, Direct: 0.1 mg/dL (ref <=0.2)
Indirect Bilirubin: 0.3 mg/dL (ref 0.2–1.2)
Total Bilirubin: 0.4 mg/dL (ref 0.2–1.2)
Total Protein: 7.4 g/dL (ref 6.1–8.1)

## 2015-11-12 LAB — BASIC METABOLIC PANEL
BUN: 21 mg/dL (ref 7–25)
CO2: 22 mmol/L (ref 20–31)
Calcium: 9.7 mg/dL (ref 8.6–10.2)
Chloride: 102 mmol/L (ref 98–110)
Creat: 1.06 mg/dL (ref 0.50–1.10)
Glucose, Bld: 77 mg/dL (ref 65–99)
Potassium: 4.7 mmol/L (ref 3.5–5.3)
Sodium: 138 mmol/L (ref 135–146)

## 2015-11-12 LAB — TSH: TSH: 1.86 mIU/L

## 2015-11-12 NOTE — Assessment & Plan Note (Signed)
Pt's PE WNL w/ exception of obesity.  UTD on pap, mammo.  Check labs.  Anticipatory guidance provided.  

## 2015-11-12 NOTE — Progress Notes (Signed)
   Subjective:    Patient ID: Phyllis Shelton, female    DOB: 1976-02-17, 40 y.o.   MRN: [id number]  HPI CPE- UTD on pap (due next year), mammo Phyllis Shelton).  No concerns today.  Exercising regularly   Review of Systems Patient reports no vision/ hearing changes, adenopathy,fever, weight change,  persistant/recurrent hoarseness , swallowing issues, chest pain, palpitations, edema, persistant/recurrent cough, hemoptysis, dyspnea (rest/exertional/paroxysmal nocturnal), gastrointestinal bleeding (melena, rectal bleeding), abdominal pain, significant heartburn, bowel changes, GU symptoms (dysuria, hematuria, incontinence), Gyn symptoms (abnormal  bleeding, pain),  syncope, focal weakness, memory loss, numbness & tingling, skin/hair/nail changes, abnormal bruising or bleeding, anxiety, or depression.     Objective:   Physical Exam General Appearance:    Alert, cooperative, no distress, appears stated age  Head:    Normocephalic, without obvious abnormality, atraumatic  Eyes:    PERRL, conjunctiva/corneas clear, EOM's intact, fundi    benign, both eyes  Ears:    Normal TM's and external ear canals, both ears  Nose:   Nares normal, septum midline, mucosa normal, no drainage    or sinus tenderness  Throat:   Lips, mucosa, and tongue normal; teeth and gums normal  Neck:   Supple, symmetrical, trachea midline, no adenopathy;    Thyroid: no enlargement/tenderness/nodules  Back:     Symmetric, no curvature, ROM normal, no CVA tenderness  Lungs:     Clear to auscultation bilaterally, respirations unlabored  Chest Wall:    No tenderness or deformity   Heart:    Regular rate and rhythm, S1 and S2 normal, no murmur, rub   or gallop  Breast Exam:    Deferred to GYN  Abdomen:     Soft, non-tender, bowel sounds active all four quadrants,    no masses, no organomegaly  Genitalia:    Deferred to GYN  Rectal:    Extremities:   Extremities normal, atraumatic, no cyanosis or edema  Pulses:   2+ and  symmetric all extremities  Skin:   Skin color, texture, turgor normal, no rashes or lesions  Lymph nodes:   Cervical, supraclavicular, and axillary nodes normal  Neurologic:   CNII-XII intact, normal strength, sensation and reflexes    throughout          Assessment & Plan:

## 2015-11-12 NOTE — Progress Notes (Signed)
Pre visit review using our clinic review tool, if applicable. No additional management support is needed unless otherwise documented below in the visit note. 

## 2015-11-12 NOTE — Patient Instructions (Signed)
Follow up in 1 year or as needed We'll notify you of your lab results and make any changes if needed Continue to work on healthy diet and regular exercise- you can do it! Call with any questions or concerns Thanks for sticking with [place]! Happy Spring!!!

## 2015-11-13 LAB — VITAMIN D 25 HYDROXY (VIT D DEFICIENCY, FRACTURES): Vit D, 25-Hydroxy: 28 ng/mL — ABNORMAL LOW (ref 30–100)

## 2015-11-18 ENCOUNTER — Ambulatory Visit: Payer: BLUE CROSS/BLUE SHIELD | Admitting: Certified Nurse Midwife

## 2016-04-02 ENCOUNTER — Encounter: Payer: Self-pay | Admitting: Certified Nurse Midwife

## 2016-04-02 ENCOUNTER — Ambulatory Visit (INDEPENDENT_AMBULATORY_CARE_PROVIDER_SITE_OTHER): Payer: BLUE CROSS/BLUE SHIELD | Admitting: Certified Nurse Midwife

## 2016-04-02 VITALS — BP 110/70 | HR 72 | Resp 20 | Ht 59.5 in | Wt 167.0 lb

## 2016-04-02 DIAGNOSIS — Z1151 Encounter for screening for human papillomavirus (HPV): Secondary | ICD-10-CM | POA: Diagnosis not present

## 2016-04-02 DIAGNOSIS — B3731 Acute candidiasis of vulva and vagina: Secondary | ICD-10-CM

## 2016-04-02 DIAGNOSIS — Z01419 Encounter for gynecological examination (general) (routine) without abnormal findings: Secondary | ICD-10-CM

## 2016-04-02 DIAGNOSIS — B373 Candidiasis of vulva and vagina: Secondary | ICD-10-CM

## 2016-04-02 DIAGNOSIS — Z124 Encounter for screening for malignant neoplasm of cervix: Secondary | ICD-10-CM | POA: Diagnosis not present

## 2016-04-02 MED ORDER — TERCONAZOLE 0.4 % VA CREA: 1.0000 | TOPICAL_CREAM | Freq: Every day | VAGINAL | 0 refills | Status: DC

## 2016-04-02 NOTE — Progress Notes (Signed)
40 y.o. G80P2002 Married  Caucasian Fe here for annual exam. Periods normal no issues. Sees Dr. Birdie Riddle , sent recently with labs, Vitamin D was low so on supplement, with PCP follow up. Started new job with [employer]. Feels she may have yeast infection. Complaining of vaginal itching, scant discharge, no odor. Walks to work and perspires, so may be the problem. No other health issues today. Daughter growing up!    Patient's last menstrual period was 03/17/2016 (exact date).          Sexually active: Yes.    The current method of family planning is vasectomy.    Exercising: Yes.    walking Smoker:  no  Health Maintenance: Pap:  05-07-14 neg MMG:  07-31-15 category b density birads 1:neg Colonoscopy:  none BMD:   none TDaP:  2011 Shingles: no Pneumonia: no Hep C and HIV: not done Labs: none Self breast exam: done occ   reports that she has never smoked. She has never used smokeless tobacco. She reports that she drinks about 0.6 - 1.8 oz of alcohol per week . She reports that she does not use drugs.  Past Medical History:  Diagnosis Date  . Heart palpitations    neg cardiology work-up  . Palpitations   . 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    Past Surgical History:  Procedure Laterality Date  . CESAREAN SECTION    . WISDOM TOOTH EXTRACTION      No current outpatient prescriptions on file.   No current facility-administered medications for this visit.     Family History  Problem Relation Age of Onset  . Cancer Paternal Grandfather     stomach  . Osteoporosis Mother     diagnosed at age 32  . Cancer Mother     thyroid  . Hypertension Father   . Aneurysm Father   . Alcohol abuse Father   . Diabetes Paternal Grandmother   . Dementia Maternal Grandmother     ROS:  Pertinent items are noted in HPI.  Otherwise, a comprehensive ROS was negative.  Exam:   BP 110/70   Pulse 72   Resp 20   Ht 4' 11.5" (1.511 m)   Wt 167 lb (75.8 kg)   LMP 03/17/2016  (Exact Date)   BMI 33.17 kg/m  Height: 4' 11.5" (151.1 cm) Ht Readings from Last 3 Encounters:  04/02/16 4' 11.5" (1.511 m)  11/12/15 5' (1.524 m)  04/02/15 4' 11.5" (1.511 m)    General appearance: alert, cooperative and appears stated age Head: Normocephalic, without obvious abnormality, atraumatic Neck: no adenopathy, supple, symmetrical, trachea midline and thyroid normal to inspection and palpation Lungs: clear to auscultation bilaterally Breasts: normal appearance, no masses or tenderness, No nipple retraction or dimpling, No nipple discharge or bleeding, No axillary or supraclavicular adenopathy Heart: regular rate and rhythm Abdomen: soft, non-tender; no masses,  no organomegaly Extremities: extremities normal, atraumatic, no cyanosis or edema Skin: Skin color, texture, turgor normal. No rashes or lesions Lymph nodes: Cervical, supraclavicular, and axillary nodes normal. No abnormal inguinal nodes palpated Neurologic: Grossly normal   Pelvic: External genitalia:  no lesions              Urethra:  normal appearing urethra with no masses, tenderness or lesions              Bartholin's and Skene's: normal                 Vagina: normal appearing vagina with normal color  and watery white non odorous discharge, no lesions wet prep taken ph 4.0              Cervix: multiparous appearance, no cervical motion tenderness and no lesions              Pap taken: Yes.   Bimanual Exam:  Uterus:  normal size, contour, position, consistency, mobility, non-tender              Adnexa: normal adnexa and no mass, fullness, tenderness               Rectovaginal: Confirms               Anus:  normal sphincter tone, no lesions  Wet prep: KOH, Saline positive yeast  Chaperone present: yes  A:  Well Woman with normal exam  Contraception spouse vasectomy  Yeast vaginiits  P:   Reviewed health and wellness pertinent to exam  Discussed finding and etiology. Try to change underwear after  walking to avoid heavy moisture which can increase yeast. Questions addressed.  Rx Terazol 7 see order with instructions  Pap smear as above with HPVHR   counseled on breast self exam, mammography screening, adequate intake of calcium and vitamin D, diet and exercise  return annually or prn  An After Visit Summary was printed and given to the patient.

## 2016-04-02 NOTE — Patient Instructions (Signed)
EXERCISE AND DIET:  We recommended that you start or continue a regular exercise program for good health. Regular exercise means any activity that makes your heart beat faster and makes you sweat.  We recommend exercising at least 30 minutes per day at least 3 days a week, preferably 4 or 5.  We also recommend a diet low in fat and sugar.  Inactivity, poor dietary choices and obesity can cause diabetes, heart attack, stroke, and kidney damage, among others.    ALCOHOL AND SMOKING:  Women should limit their alcohol intake to no more than 7 drinks/beers/glasses of wine (combined, not each!) per week. Moderation of alcohol intake to this level decreases your risk of breast cancer and liver damage. And of course, no recreational drugs are part of a healthy lifestyle.  And absolutely no smoking or even second hand smoke. Most people know smoking can cause heart and lung diseases, but did you know it also contributes to weakening of your bones? Aging of your skin?  Yellowing of your teeth and nails?  CALCIUM AND VITAMIN D:  Adequate intake of calcium and Vitamin D are recommended.  The recommendations for exact amounts of these supplements seem to change often, but generally speaking 600 mg of calcium (either carbonate or citrate) and 800 units of Vitamin D per day seems prudent. Certain women may benefit from higher intake of Vitamin D.  If you are among these women, your doctor will have told you during your visit.    PAP SMEARS:  Pap smears, to check for cervical cancer or precancers,  have traditionally been done yearly, although recent scientific advances have shown that most women can have pap smears less often.  However, every woman still should have a physical exam from her gynecologist every year. It will include a breast check, inspection of the vulva and vagina to check for abnormal growths or skin changes, a visual exam of the cervix, and then an exam to evaluate the size and shape of the uterus and  ovaries.  And after 42 years of age, a rectal exam is indicated to check for rectal cancers. We will also provide age appropriate advice regarding health maintenance, like when you should have certain vaccines, screening for sexually transmitted diseases, bone density testing, colonoscopy, mammograms, etc.   MAMMOGRAMS:  All women over 40 years old should have a yearly mammogram. Many facilities now offer a "3D" mammogram, which may cost around $50 extra out of pocket. If possible,  we recommend you accept the option to have the 3D mammogram performed.  It both reduces the number of women who will be called back for extra views which then turn out to be normal, and it is better than the routine mammogram at detecting truly abnormal areas.    COLONOSCOPY:  Colonoscopy to screen for colon cancer is recommended for all women at age 50.  We know, you hate the idea of the prep.  We agree, BUT, having colon cancer and not knowing it is worse!!  Colon cancer so often starts as a polyp that can be seen and removed at colonscopy, which can quite literally save your life!  And if your first colonoscopy is normal and you have no family history of colon cancer, most women don't have to have it again for 10 years.  Once every ten years, you can do something that may end up saving your life, right?  We will be happy to help you get it scheduled when you are ready.    Be sure to check your insurance coverage so you understand how much it will cost.  It may be covered as a preventative service at no cost, but you should check your particular policy.      Monilial Vaginitis Vaginitis in a soreness, swelling and redness (inflammation) of the vagina and vulva. Monilial vaginitis is not a sexually transmitted infection. CAUSES  Yeast vaginitis is caused by yeast (candida) that is normally found in your vagina. With a yeast infection, the candida has overgrown in number to a point that upsets the chemical balance. SYMPTOMS    White, thick vaginal discharge.  Swelling, itching, redness and irritation of the vagina and possibly the lips of the vagina (vulva).  Burning or painful urination.  Painful intercourse. DIAGNOSIS  Things that may contribute to monilial vaginitis are:  Postmenopausal and virginal states.  Pregnancy.  Infections.  Being tired, sick or stressed, especially if you had monilial vaginitis in the past.  Diabetes. Good control will help lower the chance.  Birth control pills.  Tight fitting garments.  Using bubble bath, feminine sprays, douches or deodorant tampons.  Taking certain medications that kill germs (antibiotics).  Sporadic recurrence can occur if you become ill. TREATMENT  Your caregiver will give you medication.  There are several kinds of anti monilial vaginal creams and suppositories specific for monilial vaginitis. For recurrent yeast infections, use a suppository or cream in the vagina 2 times a week, or as directed.  Anti-monilial or steroid cream for the itching or irritation of the vulva may also be used. Get your caregiver's permission.  Painting the vagina with methylene blue solution may help if the monilial cream does not work.  Eating yogurt may help prevent monilial vaginitis. HOME CARE INSTRUCTIONS   Finish all medication as prescribed.  Do not have sex until treatment is completed or after your caregiver tells you it is okay.  Take warm sitz baths.  Do not douche.  Do not use tampons, especially scented ones.  Wear cotton underwear.  Avoid tight pants and panty hose.  Tell your sexual partner that you have a yeast infection. They should go to their caregiver if they have symptoms such as mild rash or itching.  Your sexual partner should be treated as well if your infection is difficult to eliminate.  Practice safer sex. Use condoms.  Some vaginal medications cause latex condoms to fail. Vaginal medications that harm condoms  are:  Cleocin cream.  Butoconazole (Femstat).  Terconazole (Terazol) vaginal suppository.  Miconazole (Monistat) (may be purchased over the counter). SEEK MEDICAL CARE IF:   You have a temperature by mouth above 102 F (38.9 C).  The infection is getting worse after 2 days of treatment.  The infection is not getting better after 3 days of treatment.  You develop blisters in or around your vagina.  You develop vaginal bleeding, and it is not your menstrual period.  You have pain when you urinate.  You develop intestinal problems.  You have pain with sexual intercourse.   This information is not intended to replace advice given to you by your health care provider. Make sure you discuss any questions you have with your health care provider.   Document Released: 04/14/2005 Document Revised: 09/27/2011 Document Reviewed: 01/06/2015 Elsevier Interactive Patient Education [employer].

## 2016-04-04 NOTE — Progress Notes (Signed)
Encounter reviewed Jill Jertson, MD   

## 2016-04-07 LAB — IPS PAP TEST WITH HPV

## 2016-04-08 ENCOUNTER — Ambulatory Visit: Payer: BLUE CROSS/BLUE SHIELD | Admitting: Certified Nurse Midwife

## 2016-08-30 ENCOUNTER — Other Ambulatory Visit: Payer: Self-pay | Admitting: Certified Nurse Midwife

## 2016-08-30 DIAGNOSIS — Z1231 Encounter for screening mammogram for malignant neoplasm of breast: Secondary | ICD-10-CM

## 2016-09-10 ENCOUNTER — Ambulatory Visit
Admission: RE | Admit: 2016-09-10 | Discharge: 2016-09-10 | Disposition: A | Discharge status: Home / Self Care | Payer: BLUE CROSS/BLUE SHIELD | Source: Ambulatory Visit | Attending: Certified Nurse Midwife | Admitting: Certified Nurse Midwife

## 2016-09-10 DIAGNOSIS — Z1231 Encounter for screening mammogram for malignant neoplasm of breast: Secondary | ICD-10-CM

## 2016-11-18 ENCOUNTER — Ambulatory Visit (INDEPENDENT_AMBULATORY_CARE_PROVIDER_SITE_OTHER): Payer: BLUE CROSS/BLUE SHIELD | Admitting: Family Medicine

## 2016-11-18 ENCOUNTER — Encounter: Payer: Self-pay | Admitting: Family Medicine

## 2016-11-18 VITALS — BP 110/80 | HR 82 | Temp 98.2°F | Resp 16 | Ht 60.0 in | Wt 168.2 lb

## 2016-11-18 DIAGNOSIS — E559 Vitamin D deficiency, unspecified: Secondary | ICD-10-CM | POA: Diagnosis not present

## 2016-11-18 DIAGNOSIS — R5383 Other fatigue: Secondary | ICD-10-CM

## 2016-11-18 DIAGNOSIS — Z0000 Encounter for general adult medical examination without abnormal findings: Secondary | ICD-10-CM | POA: Diagnosis not present

## 2016-11-18 LAB — HEPATIC FUNCTION PANEL
ALT: 18 U/L (ref 0–35)
AST: 19 U/L (ref 0–37)
Albumin: 4.6 g/dL (ref 3.5–5.2)
Alkaline Phosphatase: 87 U/L (ref 39–117)
Bilirubin, Direct: 0.1 mg/dL (ref 0.0–0.3)
Total Bilirubin: 0.5 mg/dL (ref 0.2–1.2)
Total Protein: 7.1 g/dL (ref 6.0–8.3)

## 2016-11-18 LAB — CBC WITH DIFFERENTIAL/PLATELET
Basophils Absolute: 0.1 10*3/uL (ref 0.0–0.1)
Basophils Relative: 0.9 % (ref 0.0–3.0)
Eosinophils Absolute: 0.2 10*3/uL (ref 0.0–0.7)
Eosinophils Relative: 1.9 % (ref 0.0–5.0)
HCT: 40.7 % (ref 36.0–46.0)
Hemoglobin: 13.9 g/dL (ref 12.0–15.0)
Lymphocytes Relative: 25.9 % (ref 12.0–46.0)
Lymphs Abs: 2.3 10*3/uL (ref 0.7–4.0)
MCHC: 34.2 g/dL (ref 30.0–36.0)
MCV: 89 fl (ref 78.0–100.0)
Monocytes Absolute: 0.8 10*3/uL (ref 0.1–1.0)
Monocytes Relative: 9.3 % (ref 3.0–12.0)
Neutro Abs: 5.5 10*3/uL (ref 1.4–7.7)
Neutrophils Relative %: 62 % (ref 43.0–77.0)
Platelets: 261 10*3/uL (ref 150.0–400.0)
RBC: 4.57 Mil/uL (ref 3.87–5.11)
RDW: 13 % (ref 11.5–15.5)
WBC: 8.9 10*3/uL (ref 4.0–10.5)

## 2016-11-18 LAB — LIPID PANEL
Cholesterol: 175 mg/dL (ref 0–200)
HDL: 59.6 mg/dL (ref >39.00)
LDL Cholesterol: 101 mg/dL — ABNORMAL HIGH (ref 0–99)
NonHDL: 115.85
Total CHOL/HDL Ratio: 3
Triglycerides: 73 mg/dL (ref 0.0–149.0)
VLDL: 14.6 mg/dL (ref 0.0–40.0)

## 2016-11-18 LAB — BASIC METABOLIC PANEL
BUN: 17 mg/dL (ref 6–23)
CO2: 28 mEq/L (ref 19–32)
Calcium: 9.9 mg/dL (ref 8.4–10.5)
Chloride: 104 mEq/L (ref 96–112)
Creatinine, Ser: 0.87 mg/dL (ref 0.40–1.20)
GFR: 75.47 mL/min (ref >60.00)
Glucose, Bld: 87 mg/dL (ref 70–99)
Potassium: 4.3 mEq/L (ref 3.5–5.1)
Sodium: 139 mEq/L (ref 135–145)

## 2016-11-18 LAB — TSH: TSH: 1.46 u[IU]/mL (ref 0.35–4.50)

## 2016-11-18 LAB — VITAMIN D 25 HYDROXY (VIT D DEFICIENCY, FRACTURES): VITD: 32.64 ng/mL (ref 30.00–100.00)

## 2016-11-18 NOTE — Progress Notes (Signed)
   Subjective:    Patient ID: Phyllis Shelton, female    DOB: February 16, 1976, 41 y.o.   MRN: [id number]  HPI CPE- UTD on pap, mammo.  UTD on Tdap.   Review of Systems Patient reports no vision/ hearing changes, adenopathy,fever, weight change,  persistant/recurrent hoarseness , swallowing issues, chest pain, palpitations, edema, persistant/recurrent cough, hemoptysis, dyspnea (rest/exertional/paroxysmal nocturnal), gastrointestinal bleeding (melena, rectal bleeding), abdominal pain, significant heartburn, bowel changes, GU symptoms (dysuria, hematuria, incontinence), Gyn symptoms (abnormal  bleeding, pain),  syncope, focal weakness, memory loss, numbness & tingling, skin/hair/nail changes, abnormal bruising or bleeding, anxiety, or depression.     Objective:   Physical Exam General Appearance:    Alert, cooperative, no distress, appears stated age, overweight  Head:    Normocephalic, without obvious abnormality, atraumatic  Eyes:    PERRL, conjunctiva/corneas clear, EOM's intact, fundi    benign, both eyes  Ears:    Normal TM's and external ear canals, both ears  Nose:   Nares normal, septum midline, mucosa normal, no drainage    or sinus tenderness  Throat:   Lips, mucosa, and tongue normal; teeth and gums normal  Neck:   Supple, symmetrical, trachea midline, no adenopathy;    Thyroid: no enlargement/tenderness/nodules  Back:     Symmetric, no curvature, ROM normal, no CVA tenderness  Lungs:     Clear to auscultation bilaterally, respirations unlabored  Chest Wall:    No tenderness or deformity   Heart:    Regular rate and rhythm, S1 and S2 normal, no murmur, rub   or gallop  Breast Exam:    Deferred to GYN  Abdomen:     Soft, non-tender, bowel sounds active all four quadrants,    no masses, no organomegaly  Genitalia:    Deferred to GYN  Rectal:    Extremities:   Extremities normal, atraumatic, no cyanosis or edema  Pulses:   2+ and symmetric all extremities  Skin:   Skin color,  texture, turgor normal, no rashes or lesions  Lymph nodes:   Cervical, supraclavicular, and axillary nodes normal  Neurologic:   CNII-XII intact, normal strength, sensation and reflexes    throughout          Assessment & Plan:

## 2016-11-18 NOTE — Patient Instructions (Signed)
Follow up in 1 year or as needed We'll notify you of your lab results and make any changes if needed Continue to work on healthy diet and regular exercise- you can do it!!! If the bad days start to out number the good days- let me know and we'll work on it Call with any questions or concerns Happy Early Mother's Day!!

## 2016-11-18 NOTE — Assessment & Plan Note (Signed)
Pt's PE WNL w/ exception of being overweight.  UTD on pap, mammo.  Stressed need for healthy diet and regular exercise.  Check labs.  Anticipatory guidance provided.

## 2016-11-18 NOTE — Progress Notes (Signed)
Pre visit review using our clinic review tool, if applicable. No additional management support is needed unless otherwise documented below in the visit note. 

## 2017-04-08 ENCOUNTER — Ambulatory Visit: Payer: BLUE CROSS/BLUE SHIELD | Admitting: Certified Nurse Midwife

## 2017-04-15 ENCOUNTER — Ambulatory Visit (INDEPENDENT_AMBULATORY_CARE_PROVIDER_SITE_OTHER): Payer: BLUE CROSS/BLUE SHIELD | Admitting: Certified Nurse Midwife

## 2017-04-15 ENCOUNTER — Encounter: Payer: Self-pay | Admitting: Certified Nurse Midwife

## 2017-04-15 VITALS — BP 100/68 | HR 64 | Resp 16 | Ht 59.25 in | Wt 170.0 lb

## 2017-04-15 DIAGNOSIS — N946 Dysmenorrhea, unspecified: Secondary | ICD-10-CM

## 2017-04-15 DIAGNOSIS — N920 Excessive and frequent menstruation with regular cycle: Secondary | ICD-10-CM

## 2017-04-15 DIAGNOSIS — Z01419 Encounter for gynecological examination (general) (routine) without abnormal findings: Secondary | ICD-10-CM

## 2017-04-15 DIAGNOSIS — N938 Other specified abnormal uterine and vaginal bleeding: Secondary | ICD-10-CM

## 2017-04-15 NOTE — Patient Instructions (Signed)

## 2017-04-15 NOTE — Progress Notes (Signed)
41 y.o. G73P2002 Married  Caucasian Fe here for annual exam. Periods normal at 7 days moderate to light, Has had occurrence of period and then no period and then 3 days of light bleeding. Does this every 3- 4 months. This is her normal with no OCP use, prior to spouse vasectomy.  Has noted increase headaches with longer cycles. Recent PCP aex visit with labs no anemia(labs in epic). Sees Dr. Wonda Horner . Would  consider cycle control if period continues to change. No other health issues today.  Patient's last menstrual period was 04/07/2017.          Sexually active: Yes.    The current method of family planning is vasectomy.    Exercising: Yes.    walking, some strength training Smoker:  no  Health Maintenance: Pap:  05-07-14 neg, 04-02-16 neg History of Abnormal Pap: no MMG:  09-10-16 category b density birads 1:neg Self Breast exams: no Colonoscopy:  none BMD:   none TDaP:  2011 Shingles: no Pneumonia: no Hep C and HIV: not done Labs: if needed.   reports that she has never smoked. She has never used smokeless tobacco. She reports that she drinks about 1.2 oz of alcohol per week . She reports that she does not use drugs.  Past Medical History:  Diagnosis Date  . Heart palpitations    neg cardiology work-up  . Palpitations   . 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    Past Surgical History:  Procedure Laterality Date  . CESAREAN SECTION    . WISDOM TOOTH EXTRACTION      No current outpatient prescriptions on file.   No current facility-administered medications for this visit.     Family History  Problem Relation Age of Onset  . Osteoporosis Mother        diagnosed at age 60  . Cancer Mother        thyroid  . Hypertension Father   . Aneurysm Father   . Alcohol abuse Father   . Dementia Maternal Grandmother   . Cancer Paternal Grandfather        stomach  . Diabetes Paternal Grandmother     ROS:  Pertinent items are noted in HPI.  Otherwise, a comprehensive ROS  was negative.  Exam:   BP 100/68   Pulse 64   Resp 16   Ht 4' 11.25" (1.505 m)   Wt 170 lb (77.1 kg)   LMP 04/07/2017   BMI 34.05 kg/m  Height: 4' 11.25" (150.5 cm) Ht Readings from Last 3 Encounters:  04/15/17 4' 11.25" (1.505 m)  11/18/16 5' (1.524 m)  04/02/16 4' 11.5" (1.511 m)    General appearance: alert, cooperative and appears stated age Head: Normocephalic, without obvious abnormality, atraumatic Neck: no adenopathy, supple, symmetrical, trachea midline and thyroid normal to inspection and palpation Lungs: clear to auscultation bilaterally Breasts: normal appearance, no masses or tenderness, No nipple retraction or dimpling, No nipple discharge or bleeding, No axillary or supraclavicular adenopathy Heart: regular rate and rhythm Abdomen: soft, non-tender; no masses,  no organomegaly Extremities: extremities normal, atraumatic, no cyanosis or edema Skin: Skin color, texture, turgor normal. No rashes or lesions Lymph nodes: Cervical, supraclavicular, and axillary nodes normal. No abnormal inguinal nodes palpated Neurologic: Grossly normal   Pelvic: External genitalia:  no lesions              Urethra:  normal appearing urethra with no masses, tenderness or lesions  Bartholin's and Skene's: normal                 Vagina: normal appearing vagina with normal color and discharge, no lesions              Cervix: no cervical motion tenderness, no lesions and appearance              Pap taken: no Bimanual Exam:  Uterus:  normal size, contour, position, consistency, mobility, non-tender with questionable enlargement,               Adnexa: normal adnexa and no mass, fullness, tenderness               Rectovaginal: Confirms               Anus:  normal sphincter tone, no lesions  Chaperone present: yes  A:  Well Woman with normal exam  Contraception spouse vasectomy  DUB with every third cycle with menorrhagia/dysmennorhea  ? Enlarged uterus  P:   Reviewed  health and wellness pertinent to exam  Discussed with continued occurrence with increasing amount of bleeding and cramping and with ? Enlargement of uterus, recommend PUS evaluation, prior to discussing plan of management for cycle control. Patient  Agreeable. Patient will be called to schedule and will be called with insurance info also. Questions addressed. Warning signs of heavy bleeding discussed and need to advise.  Pap smear: no   counseled on breast self exam, mammography screening, adequate intake of calcium and vitamin D, diet and exercise  return annually or prn  An After Visit Summary was printed and given to the patient.

## 2017-04-20 ENCOUNTER — Telehealth: Payer: Self-pay | Admitting: Obstetrics & Gynecology

## 2017-04-20 NOTE — Telephone Encounter (Signed)
Patient cancelled her ultrasound for 05/05/17 and would like a call back to reschedule.

## 2017-04-20 NOTE — Telephone Encounter (Signed)
Spoke with patient to reschedule PUS for DUB. Patient request to schedule in January 2019, declines earlier appointment date at this time. Patient states she discussed signs of heavy bleeding with [name], CNM -is aware to return call to office should this occur.   Patient scheduled for PUS 07/28/17 at 12:30pm, with consult to follow at 1pm with Dr. Sabra Heck.   Advised patient would update providers and return call with any additional recommendations.   Routing to provider for final review. Patient is agreeable to disposition. Will close encounter.   Cc: Dr. Sabra Heck

## 2017-04-20 NOTE — Telephone Encounter (Signed)
Patient returning your call.

## 2017-04-20 NOTE — Telephone Encounter (Signed)
Left message to call Jill at [phone].  

## 2017-04-21 NOTE — Telephone Encounter (Signed)
Spoke with patient, advised as seen below per [name], CNM. Patient verbalizes understanding and is agreeable.   Will close encounter.

## 2017-04-21 NOTE — Telephone Encounter (Signed)
If episodic bleeding occurs more frequently of pelvic pain needs to have PUS and OV as recommended.

## 2017-05-05 ENCOUNTER — Other Ambulatory Visit: Payer: BLUE CROSS/BLUE SHIELD

## 2017-05-05 ENCOUNTER — Other Ambulatory Visit: Payer: BLUE CROSS/BLUE SHIELD | Admitting: Obstetrics & Gynecology

## 2017-07-26 ENCOUNTER — Other Ambulatory Visit: Payer: Self-pay | Admitting: *Deleted

## 2017-07-26 DIAGNOSIS — N938 Other specified abnormal uterine and vaginal bleeding: Secondary | ICD-10-CM

## 2017-07-26 DIAGNOSIS — N946 Dysmenorrhea, unspecified: Secondary | ICD-10-CM

## 2017-07-26 DIAGNOSIS — N920 Excessive and frequent menstruation with regular cycle: Secondary | ICD-10-CM

## 2017-07-28 ENCOUNTER — Ambulatory Visit (INDEPENDENT_AMBULATORY_CARE_PROVIDER_SITE_OTHER): Payer: BLUE CROSS/BLUE SHIELD

## 2017-07-28 ENCOUNTER — Ambulatory Visit (INDEPENDENT_AMBULATORY_CARE_PROVIDER_SITE_OTHER): Payer: BLUE CROSS/BLUE SHIELD | Admitting: Obstetrics & Gynecology

## 2017-07-28 ENCOUNTER — Encounter: Payer: Self-pay | Admitting: Obstetrics & Gynecology

## 2017-07-28 VITALS — BP 112/86 | HR 64 | Resp 16 | Ht 59.25 in | Wt 171.0 lb

## 2017-07-28 DIAGNOSIS — N800 Endometriosis of uterus: Secondary | ICD-10-CM | POA: Diagnosis not present

## 2017-07-28 DIAGNOSIS — N938 Other specified abnormal uterine and vaginal bleeding: Secondary | ICD-10-CM

## 2017-07-28 DIAGNOSIS — Z3009 Encounter for other general counseling and advice on contraception: Secondary | ICD-10-CM

## 2017-07-28 DIAGNOSIS — N8003 Adenomyosis of the uterus: Secondary | ICD-10-CM

## 2017-07-28 DIAGNOSIS — N946 Dysmenorrhea, unspecified: Secondary | ICD-10-CM

## 2017-07-28 DIAGNOSIS — N809 Endometriosis, unspecified: Secondary | ICD-10-CM

## 2017-07-28 DIAGNOSIS — N920 Excessive and frequent menstruation with regular cycle: Secondary | ICD-10-CM

## 2017-07-28 NOTE — Patient Instructions (Signed)
Please let us know when the flow starts to improve.  Take 800mg  Motrin (4-200mg  tablets) about a hour before you have your IUD inserted.     Adenomyosis

## 2017-07-28 NOTE — Progress Notes (Signed)
GYNECOLOGY  VISIT  CC:   Heavy vaginal bleeding  HPI: 42 y.o. G25P2002 Married Caucasian female here for ultrasound due to possibly enlarged uterus on physical exam and menorrhagia.  Cycles have heavier for over a year.  Flow lasts 7-9 days.  On the heaviest days, she need to change products every hours.  This is an overnight pad.  She cannot wear tampons because they don't give her enough protection.  She does pass clots and does have a lot of cramping.  She was hoping this would just "get better" and normalize but hasn't.   Denies light headed feelings, dizziness, SOB or palpitations.  GYNECOLOGIC HISTORY: Patient's last menstrual period was 07/28/2017. Contraception: nothing  Patient Active Problem List   Diagnosis Date Noted  . Vitamin D deficiency 11/18/2016  . Physical exam 11/12/2015  . Athlete's foot on right 10/02/2013  . Bronchitis with bronchospasm 08/15/2013  . Obesity (BMI 30-39.9) 07/25/2013  . Palpitations 11/16/2011  . 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    Past Medical History:  Diagnosis Date  . Heart palpitations    neg cardiology work-up  . Palpitations   . 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    Past Surgical History:  Procedure Laterality Date  . CESAREAN SECTION    . WISDOM TOOTH EXTRACTION      MEDS:   No current outpatient medications on file prior to visit.   No current facility-administered medications on file prior to visit.     ALLERGIES: Darvocet [propoxyphene n-acetaminophen]  Family History  Problem Relation Age of Onset  . Osteoporosis Mother        diagnosed at age 91  . Cancer Mother        thyroid  . Hypertension Father   . Aneurysm Father   . Alcohol abuse Father   . Dementia Maternal Grandmother   . Cancer Paternal Grandfather        stomach  . Diabetes Paternal Grandmother     SH:  Married, non smoker  Review of Systems  Genitourinary:       Heavy vaginal bleeding  All other systems reviewed and are  negative.   PHYSICAL EXAMINATION:    BP 112/86 (BP Location: Right Arm, Patient Position: Sitting, Cuff Size: Large)   Pulse 64   Resp 16   Ht 4' 11.25" (1.505 m)   Wt 171 lb (77.6 kg)   LMP 07/28/2017   BMI 34.25 kg/m     General appearance: alert, cooperative and appears stated age CV:  Regular rate and rhythm Lungs:  clear to auscultation, no wheezes, rales or rhonchi, symmetric air entry  Ultrasound results: Uterus:  9.2 x 5.4 x 5.1cm, possible adenomyosis Endometrium:  12.22mm Left ovary:  3.6 x 3.0 x 3.2TF with 5.7DU follicle Right ovary:  2.8 x 1.8 x 1.9cm Cul de sac:  Neg for free fluid  Findings reviewed.  Due to bleeding, iron studies recommended.  Also, endometrial biopsy recommended due to cycle flow and endometrial appearance on ultrasound.  Declines to do this today.  Options for treatment discussed--OCPs, POP, other progesterone methods, Progesterone IUD, endometrial ablation (which will likely have a higher failure rate due to possible adenomyosis), hysterectomy.  She would like to proceed with IUD placement as this will give her contracpetion as well and this is something she wanted to discuss today as well.  Will do biopsy same day.  Pt comfortable with plan.  Risks, benefits of above procedures all reviewed.  Assessment: Menorrhagia Possible adenomoysis Contraception needs  Plan:  TIBC and iron levels, ferritin obtained today Will return for endometrial biopsy and Mirena IUD placement.  Cycle just getting ready to start.  Pt will call when flow lessens to decrease risk of IUD expulsion.  Will need to do in first 9 days of cycle as pt is not using any other contraception.   ~40 minutes spent with patient >50% of time was in face to face discussion of above.  Lengthy visit with pt due to questions and discussion of options.

## 2017-07-29 LAB — FERRITIN: Ferritin: 46 ng/mL (ref 15–150)

## 2017-07-29 LAB — IRON AND TIBC
Iron Saturation: 13 % — ABNORMAL LOW (ref 15–55)
Iron: 52 ug/dL (ref 27–159)
Total Iron Binding Capacity: 391 ug/dL (ref 250–450)
UIBC: 339 ug/dL (ref 131–425)

## 2017-07-31 ENCOUNTER — Encounter: Payer: Self-pay | Admitting: Obstetrics & Gynecology

## 2017-07-31 DIAGNOSIS — N809 Endometriosis, unspecified: Secondary | ICD-10-CM

## 2017-07-31 DIAGNOSIS — N800 Endometriosis of uterus: Secondary | ICD-10-CM | POA: Insufficient documentation

## 2017-07-31 DIAGNOSIS — N920 Excessive and frequent menstruation with regular cycle: Secondary | ICD-10-CM | POA: Insufficient documentation

## 2017-07-31 DIAGNOSIS — N8003 Adenomyosis of the uterus: Secondary | ICD-10-CM | POA: Insufficient documentation

## 2017-08-04 ENCOUNTER — Other Ambulatory Visit: Payer: Self-pay | Admitting: Certified Nurse Midwife

## 2017-08-04 DIAGNOSIS — Z1231 Encounter for screening mammogram for malignant neoplasm of breast: Secondary | ICD-10-CM

## 2017-09-09 ENCOUNTER — Encounter: Payer: Self-pay | Admitting: Obstetrics & Gynecology

## 2017-09-09 ENCOUNTER — Telehealth: Payer: Self-pay | Admitting: Obstetrics & Gynecology

## 2017-09-09 DIAGNOSIS — N920 Excessive and frequent menstruation with regular cycle: Secondary | ICD-10-CM

## 2017-09-09 DIAGNOSIS — Z30430 Encounter for insertion of intrauterine contraceptive device: Secondary | ICD-10-CM

## 2017-09-09 NOTE — Telephone Encounter (Signed)
Patient sent the following correspondence through [place]. Routing to triage to assist patient with request.  ----- Message from [place], Generic sent at 09/09/2017 10:00 AM EST -----    Hi Dr. Sabra Heck,    When you and I met in January, it seemed that my next cycle was beginning and I'd be in for an IUD the next week. I spotted off and on for a month and a half, and my cycle has actually just started. With terrible, terrible gusto, in fact.     My question is whether I should come in to discuss the strangeness of this before going with the IUD or if the IUD is the best route still. Do you have advice on this? Is it better for me to come in to discuss it?    Thanks!  Phyllis Shelton

## 2017-09-09 NOTE — Telephone Encounter (Signed)
Spoke with patient. Patient states that when she was seen on 07/28/2017 with Dr.Miller she had an ultrasound and expected to have a menses due to the thickening of her lining. States she spotted off and on for a month and a half and just started her menses yesterday. Is wearing an overnight pad which she changed every 3 hours. Reports this is her normal. Does not have any clotting but does have a lot of cramping. Patient is asking if she may proceed with an IUD or if she will need further evaluation first as it is abnormal for her to have spotting for that long prior to her menses. Advised will review with Dr.Miller and return call.

## 2017-09-09 NOTE — Telephone Encounter (Signed)
Can proceed with IUD placement.  I will do an endometrial biopsy right before I put in the IUD to make sure the cells are normal.  I don't think she needs to have a separate appt for this.  Thanks.

## 2017-09-09 NOTE — Telephone Encounter (Signed)
Left message to call Jill at [phone].  

## 2017-09-09 NOTE — Telephone Encounter (Signed)
Patient returned call to nurse, Sharee Pimple.

## 2017-09-12 ENCOUNTER — Ambulatory Visit
Admission: RE | Admit: 2017-09-12 | Discharge: 2017-09-12 | Disposition: A | Discharge status: Home / Self Care | Payer: BLUE CROSS/BLUE SHIELD | Source: Ambulatory Visit | Attending: Certified Nurse Midwife | Admitting: Certified Nurse Midwife

## 2017-09-12 DIAGNOSIS — Z1231 Encounter for screening mammogram for malignant neoplasm of breast: Secondary | ICD-10-CM

## 2017-09-13 NOTE — Telephone Encounter (Signed)
Spoke with patient. Patient states that she would like to proceed with appointment, but wants to know benefits before proceeding this month. Advised will have insurance and billing contact her to discuss before proceeding with scheduling. Patient is agreeable.  Routing to [employer]  and [employer].

## 2017-09-13 NOTE — Addendum Note (Signed)
Addended by: Gwendlyn Deutscher on: 09/13/2017 03:16 PM   Modules accepted: Orders

## 2017-09-15 ENCOUNTER — Encounter: Payer: Self-pay | Admitting: Obstetrics & Gynecology

## 2017-09-15 ENCOUNTER — Ambulatory Visit (INDEPENDENT_AMBULATORY_CARE_PROVIDER_SITE_OTHER): Payer: BLUE CROSS/BLUE SHIELD | Admitting: Obstetrics & Gynecology

## 2017-09-15 ENCOUNTER — Other Ambulatory Visit: Payer: Self-pay

## 2017-09-15 DIAGNOSIS — N920 Excessive and frequent menstruation with regular cycle: Secondary | ICD-10-CM

## 2017-09-15 DIAGNOSIS — Z30430 Encounter for insertion of intrauterine contraceptive device: Secondary | ICD-10-CM | POA: Diagnosis not present

## 2017-09-15 LAB — POCT URINE PREGNANCY: Preg Test, Ur: NEGATIVE

## 2017-09-15 NOTE — Progress Notes (Signed)
42 y.o. G66P2002 Married Caucasian female presents for insertion of .  Pt has been counseled about alternative forms of contraception including OCPs, progesterone options, sterilization procedures, condoms, and natural family planning.  She feels IUD is the better option for her.  Pt has also been counseled about risks and benefits as well as complications.  Consent is obtained today.  All questions answered prior to start of procedure.    Pt aware endometrial biopsy is also being done today due to her hx of menorrhagia.  Consent obtained as well.  Current contraception: none Last STD testing:  N/a, in long term monogamous marriage  LMP:  Patient's last menstrual period was 09/09/2017.  Patient Active Problem List   Diagnosis Date Noted  . Adenomyosis 07/31/2017  . Menorrhagia with regular cycle 07/31/2017  . Vitamin D deficiency 11/18/2016  . Athlete's foot on right 10/02/2013  . Obesity (BMI 30-39.9) 07/25/2013  . 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   Past Medical History:  Diagnosis Date  . Heart palpitations    neg cardiology work-up  . Palpitations   . 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   No current outpatient medications on file prior to visit.   No current facility-administered medications on file prior to visit.    Darvocet [propoxyphene n-acetaminophen]  Review of Systems  All other systems reviewed and are negative.  Vitals:   09/15/17 1513  BP: 118/70  Pulse: 64  Resp: 16  Weight: 166 lb (75.3 kg)    Gen:  WNWF healthy female NAD Abdomen: soft, non-tender Groin:  no inguinal nodes palpated  Pelvic exam: Vulva:  normal female genitalia Vagina:  normal vagina Cervix:  Non-tender, Negative CMT, no lesions or redness. Uterus:  normal shape, position and consistency   Procedure:  Speculum reinserted.  Cervix visualized and cleansed with Betadine x 3.  Paracervical block was not placed.  Single toothed tenaculum applied to anterior lip of cervix  without difficulty.  Endometrial pipelle passed to fundus at 7.5 cm.  Good tissue sample obtained.  Then, IUD package was opened.  IUD and introducer passed to fundus and then withdrawn slightly before IUD was passed into endometrial cavity.  Introducer removed.  Strings cut to 2cm.  Lot number: [id number].  Expiration:  12/2019.  Tenaculum removed from cervix.  Minimal bleeding noted.  Pt tolerated the procedure well.  All instruments removed from vagina.  A: Insertion of Mirean IUD Contraception desires Menorrhagia  P:  Return for recheck 6-8 weeks Pt aware to call for any concerns Pt aware removal due no later than 09/15/2022.  IUD card given to pt.

## 2017-09-20 ENCOUNTER — Telehealth: Payer: Self-pay

## 2017-09-20 NOTE — Telephone Encounter (Signed)
-----   Message from Megan Salon, MD sent at 09/19/2017 10:57 PM EST ----- Please let pt know her endometrial biopsy showed no abnormal cells.  She should have a recheck after about 6-8 weeks from IUD placement.

## 2017-09-20 NOTE — Telephone Encounter (Signed)
Spoke with patient. Results given. Patient verbalizes understanding. Has an appointment on 11/15/2017 at 2:30 pm with Dr.Miller. Encounter closed.

## 2017-11-15 ENCOUNTER — Encounter: Payer: Self-pay | Admitting: Obstetrics & Gynecology

## 2017-11-15 ENCOUNTER — Other Ambulatory Visit: Payer: Self-pay

## 2017-11-15 ENCOUNTER — Ambulatory Visit (INDEPENDENT_AMBULATORY_CARE_PROVIDER_SITE_OTHER): Payer: BLUE CROSS/BLUE SHIELD | Admitting: Obstetrics & Gynecology

## 2017-11-15 VITALS — BP 126/84 | HR 84 | Resp 16 | Ht 59.25 in | Wt 168.0 lb

## 2017-11-15 DIAGNOSIS — Z30431 Encounter for routine checking of intrauterine contraceptive device: Secondary | ICD-10-CM

## 2017-11-15 NOTE — Progress Notes (Signed)
GYNECOLOGY  VISIT  CC:   IUD recheck  HPI: 42 y.o. G41P2002 Married Caucasian female here for IUD check.  Feeling really happy about choice with IUD.  Has had two cycles since IUD was placed.  Flow is much lighter and actually ends so she is really happy with the choice.  No pain with intercourse.    GYNECOLOGIC HISTORY: Patient's last menstrual period was 10/27/2017. Contraception:  Mirena placed 09/15/17 Menopausal hormone therapy: none  Patient Active Problem List   Diagnosis Date Noted  . Adenomyosis 07/31/2017  . Menorrhagia with regular cycle 07/31/2017  . Vitamin D deficiency 11/18/2016  . Athlete's foot on right 10/02/2013  . Obesity (BMI 30-39.9) 07/25/2013  . 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    Past Medical History:  Diagnosis Date  . Heart palpitations    neg cardiology work-up  . Palpitations   . 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    Past Surgical History:  Procedure Laterality Date  . CESAREAN SECTION    . WISDOM TOOTH EXTRACTION      MEDS:   Current Outpatient Medications on File Prior to Visit  Medication Sig Dispense Refill  . levonorgestrel (MIRENA) 20 MCG/24HR IUD 1 each by Intrauterine route once. Placed 09/15/17     No current facility-administered medications on file prior to visit.     ALLERGIES: Darvocet [propoxyphene n-acetaminophen]  Family History  Problem Relation Age of Onset  . Osteoporosis Mother        diagnosed at age 84  . Cancer Mother        thyroid  . Hypertension Father   . Aneurysm Father   . Alcohol abuse Father   . Dementia Maternal Grandmother   . Cancer Paternal Grandfather        stomach  . Diabetes Paternal Grandmother     SH:  Married, non smoker  Review of Systems  All other systems reviewed and are negative.   PHYSICAL EXAMINATION:    BP 126/84 (BP Location: Right Arm, Patient Position: Sitting, Cuff Size: Normal)   Pulse 84   Resp 16   Ht 4' 11.25" (1.505 m)   Wt 168 lb (76.2 kg)    LMP 10/27/2017   BMI 33.65 kg/m     General appearance: alert, cooperative and appears stated age  Pelvic: External genitalia:  no lesions              Urethra:  normal appearing urethra with no masses, tenderness or lesions              Bartholins and Skenes: normal                 Vagina: normal appearing vagina with normal color and discharge, no lesions              Cervix: no lesions and IUD string noted              Bimanual Exam:  Uterus:  normal size, contour, position, consistency, mobility, non-tender              Adnexa: no mass, fullness, tenderness  Assessment: IUD recheck Menorrhagia with irregular bleeding that is improved  Plan: Pt will continue to monitor bleeding and has AEX scheduled with [facility] later this year.  She knows to call with any concerns.

## 2017-11-22 ENCOUNTER — Encounter: Payer: Self-pay | Admitting: Family Medicine

## 2017-11-22 ENCOUNTER — Other Ambulatory Visit: Payer: Self-pay

## 2017-11-22 ENCOUNTER — Ambulatory Visit (INDEPENDENT_AMBULATORY_CARE_PROVIDER_SITE_OTHER): Payer: BLUE CROSS/BLUE SHIELD | Admitting: Family Medicine

## 2017-11-22 VITALS — BP 124/84 | HR 74 | Temp 98.5°F | Resp 16 | Ht 59.0 in | Wt 168.0 lb

## 2017-11-22 DIAGNOSIS — E559 Vitamin D deficiency, unspecified: Secondary | ICD-10-CM

## 2017-11-22 DIAGNOSIS — Z0000 Encounter for general adult medical examination without abnormal findings: Secondary | ICD-10-CM | POA: Diagnosis not present

## 2017-11-22 DIAGNOSIS — E669 Obesity, unspecified: Secondary | ICD-10-CM | POA: Diagnosis not present

## 2017-11-22 LAB — CBC WITH DIFFERENTIAL/PLATELET
Basophils Absolute: 0.1 10*3/uL (ref 0.0–0.1)
Basophils Relative: 0.9 % (ref 0.0–3.0)
Eosinophils Absolute: 0.2 10*3/uL (ref 0.0–0.7)
Eosinophils Relative: 2.6 % (ref 0.0–5.0)
HCT: 41.8 % (ref 36.0–46.0)
Hemoglobin: 14.3 g/dL (ref 12.0–15.0)
Lymphocytes Relative: 28.2 % (ref 12.0–46.0)
Lymphs Abs: 2.4 10*3/uL (ref 0.7–4.0)
MCHC: 34.2 g/dL (ref 30.0–36.0)
MCV: 88.9 fl (ref 78.0–100.0)
Monocytes Absolute: 0.7 10*3/uL (ref 0.1–1.0)
Monocytes Relative: 8.2 % (ref 3.0–12.0)
Neutro Abs: 5.2 10*3/uL (ref 1.4–7.7)
Neutrophils Relative %: 60.1 % (ref 43.0–77.0)
Platelets: 249 10*3/uL (ref 150.0–400.0)
RBC: 4.7 Mil/uL (ref 3.87–5.11)
RDW: 13.1 % (ref 11.5–15.5)
WBC: 8.6 10*3/uL (ref 4.0–10.5)

## 2017-11-22 LAB — LIPID PANEL
Cholesterol: 169 mg/dL (ref 0–200)
HDL: 57.3 mg/dL (ref >39.00)
LDL Cholesterol: 95 mg/dL (ref 0–99)
NonHDL: 112.18
Total CHOL/HDL Ratio: 3
Triglycerides: 87 mg/dL (ref 0.0–149.0)
VLDL: 17.4 mg/dL (ref 0.0–40.0)

## 2017-11-22 LAB — BASIC METABOLIC PANEL
BUN: 14 mg/dL (ref 6–23)
CO2: 27 mEq/L (ref 19–32)
Calcium: 9.8 mg/dL (ref 8.4–10.5)
Chloride: 105 mEq/L (ref 96–112)
Creatinine, Ser: 0.85 mg/dL (ref 0.40–1.20)
GFR: 77.16 mL/min (ref >60.00)
Glucose, Bld: 90 mg/dL (ref 70–99)
Potassium: 4.4 mEq/L (ref 3.5–5.1)
Sodium: 140 mEq/L (ref 135–145)

## 2017-11-22 LAB — HEPATIC FUNCTION PANEL
ALT: 15 U/L (ref 0–35)
AST: 18 U/L (ref 0–37)
Albumin: 4.5 g/dL (ref 3.5–5.2)
Alkaline Phosphatase: 99 U/L (ref 39–117)
Bilirubin, Direct: 0.1 mg/dL (ref 0.0–0.3)
Total Bilirubin: 0.3 mg/dL (ref 0.2–1.2)
Total Protein: 7.2 g/dL (ref 6.0–8.3)

## 2017-11-22 LAB — TSH: TSH: 2.37 u[IU]/mL (ref 0.35–4.50)

## 2017-11-22 LAB — VITAMIN D 25 HYDROXY (VIT D DEFICIENCY, FRACTURES): VITD: 32.7 ng/mL (ref 30.00–100.00)

## 2017-11-22 NOTE — Patient Instructions (Signed)
Follow up in 1 year or as needed We'll notify you of your lab results and make any changes if needed Continue to work on healthy diet and regular exercise- you can do it!!! Call with any questions or concerns Happy Mother's Day!!!

## 2017-11-22 NOTE — Progress Notes (Signed)
   Subjective:    Patient ID: Phyllis Shelton, female    DOB: 21-May-1976, 42 y.o.   MRN: [id number]  HPI CPE- UTD on GYN.  UTD on flu and Tdap.  Weight is stable.   Review of Systems Patient reports no vision/ hearing changes, adenopathy,fever, weight change,  persistant/recurrent hoarseness , swallowing issues, chest pain, palpitations, edema, persistant/recurrent cough, hemoptysis, dyspnea (rest/exertional/paroxysmal nocturnal), gastrointestinal bleeding (melena, rectal bleeding), abdominal pain, significant heartburn, bowel changes, GU symptoms (dysuria, hematuria, incontinence), Gyn symptoms (abnormal  bleeding, pain),  syncope, focal weakness, memory loss, numbness & tingling, skin/hair/nail changes, abnormal bruising or bleeding, anxiety, or depression.     Objective:   Physical Exam General Appearance:    Alert, cooperative, no distress, appears stated age  Head:    Normocephalic, without obvious abnormality, atraumatic  Eyes:    PERRL, conjunctiva/corneas clear, EOM's intact, fundi    benign, both eyes  Ears:    Normal TM's and external ear canals, both ears  Nose:   Nares normal, septum midline, mucosa normal, no drainage    or sinus tenderness  Throat:   Lips, mucosa, and tongue normal; teeth and gums normal  Neck:   Supple, symmetrical, trachea midline, no adenopathy;    Thyroid: no enlargement/tenderness/nodules  Back:     Symmetric, no curvature, ROM normal, no CVA tenderness  Lungs:     Clear to auscultation bilaterally, respirations unlabored  Chest Wall:    No tenderness or deformity   Heart:    Regular rate and rhythm, S1 and S2 normal, no murmur, rub   or gallop  Breast Exam:    Deferred to GYN  Abdomen:     Soft, non-tender, bowel sounds active all four quadrants,    no masses, no organomegaly  Genitalia:    Deferred to GYN  Rectal:    Extremities:   Extremities normal, atraumatic, no cyanosis or edema  Pulses:   2+ and symmetric all extremities  Skin:   Skin  color, texture, turgor normal, no rashes or lesions  Lymph nodes:   Cervical, supraclavicular, and axillary nodes normal  Neurologic:   CNII-XII intact, normal strength, sensation and reflexes    throughout          Assessment & Plan:

## 2017-11-22 NOTE — Assessment & Plan Note (Signed)
Pt's PE unchanged from previous and WNL w/ exception of obesity.  Check labs.  Anticipatory guidance provided.

## 2017-11-22 NOTE — Assessment & Plan Note (Signed)
Pt has hx of this, check labs and replete prn. 

## 2017-11-22 NOTE — Assessment & Plan Note (Signed)
Ongoing issue for pt.  Stressed need for healthy diet and regular exercise.  Check labs to risk stratify.  Will follow 

## 2017-11-23 ENCOUNTER — Encounter: Payer: Self-pay | Admitting: General Practice

## 2018-04-26 ENCOUNTER — Ambulatory Visit: Payer: BLUE CROSS/BLUE SHIELD | Admitting: Certified Nurse Midwife

## 2018-04-26 ENCOUNTER — Telehealth: Payer: Self-pay | Admitting: Certified Nurse Midwife

## 2018-04-26 NOTE — Telephone Encounter (Signed)
Patient canceled her aex today to "stomach bug". Patient to call later to reschedule.

## 2018-05-16 ENCOUNTER — Ambulatory Visit (INDEPENDENT_AMBULATORY_CARE_PROVIDER_SITE_OTHER): Payer: BLUE CROSS/BLUE SHIELD | Admitting: Certified Nurse Midwife

## 2018-05-16 ENCOUNTER — Encounter: Payer: Self-pay | Admitting: Certified Nurse Midwife

## 2018-05-16 ENCOUNTER — Other Ambulatory Visit: Payer: Self-pay

## 2018-05-16 VITALS — BP 120/78 | HR 64 | Resp 16 | Ht 59.25 in | Wt 167.0 lb

## 2018-05-16 DIAGNOSIS — Z30431 Encounter for routine checking of intrauterine contraceptive device: Secondary | ICD-10-CM

## 2018-05-16 DIAGNOSIS — Z01419 Encounter for gynecological examination (general) (routine) without abnormal findings: Secondary | ICD-10-CM

## 2018-05-16 NOTE — Progress Notes (Signed)
42 y.o. G34P2002 Married  Caucasian Fe here for annual exam. Periods sporadic and some spotting, but no warning signs with Mirena IUD. Social stress with son in first year of college! Sees Dr. Bennye Alm for aex and labs. No health issues today.  No LMP recorded. (Menstrual status: IUD).          Sexually active: Yes.    The current method of family planning is vasectomy & IUD.    Exercising: Yes.    circuit training, boot camp, walking Smoker:  no  Review of Systems  Constitutional: Negative.   HENT: Negative.   Eyes: Negative.   Respiratory: Negative.   Cardiovascular: Negative.   Gastrointestinal: Negative.   Genitourinary: Negative.   Musculoskeletal: Negative.   Skin: Negative.   Neurological: Negative.   Endo/Heme/Allergies: Negative.   Psychiatric/Behavioral: Negative.     Health Maintenance: Pap:  04-02-16 neg HPV HR neg History of Abnormal Pap: no MMG:  09-12-17 category b density birads 1:neg Self Breast exams: no Colonoscopy:  None  BMD:   none TDaP:  2011 Shingles: no Pneumonia: no Hep C and HIV: not done Labs: with Dr. Bennye Alm   reports that she has never smoked. She has never used smokeless tobacco. She reports that she drinks about 2.0 standard drinks of alcohol per week. She reports that she does not use drugs.  Past Medical History:  Diagnosis Date  . Heart palpitations    neg cardiology work-up  . Palpitations   . 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    Past Surgical History:  Procedure Laterality Date  . CESAREAN SECTION    . WISDOM TOOTH EXTRACTION      Current Outpatient Medications  Medication Sig Dispense Refill  . levonorgestrel (MIRENA) 20 MCG/24HR IUD 1 each by Intrauterine route once. Placed 09/15/17     No current facility-administered medications for this visit.     Family History  Problem Relation Age of Onset  . Osteoporosis Mother        diagnosed at age 30  . Cancer Mother        thyroid  . Hypertension Father   .  Aneurysm Father   . Alcohol abuse Father   . Dementia Maternal Grandmother   . Cancer Paternal Grandfather        stomach  . Diabetes Paternal Grandmother     ROS:  Pertinent items are noted in HPI.  Otherwise, a comprehensive ROS was negative.  Exam:   There were no vitals taken for this visit.   Ht Readings from Last 3 Encounters:  11/22/17 4\' 11"  (1.499 m)  11/15/17 4' 11.25" (1.505 m)  07/28/17 4' 11.25" (1.505 m)    General appearance: alert, cooperative and appears stated age Head: Normocephalic, without obvious abnormality, atraumatic Neck: no adenopathy, supple, symmetrical, trachea midline and thyroid normal to inspection and palpation Lungs: clear to auscultation bilaterally Breasts: normal appearance, no masses or tenderness, No nipple retraction or dimpling, No nipple discharge or bleeding, No axillary or supraclavicular adenopathy Heart: regular rate and rhythm Abdomen: soft, non-tender; no masses,  no organomegaly Extremities: extremities normal, atraumatic, no cyanosis or edema Skin: Skin color, texture, turgor normal. No rashes or lesions Lymph nodes: Cervical, supraclavicular, and axillary nodes normal. No abnormal inguinal nodes palpated Neurologic: Grossly normal   Pelvic: External genitalia:  no lesions              Urethra:  normal appearing urethra with no masses, tenderness or lesions  Bartholin's and Skene's: normal                 Vagina: normal appearing vagina with normal color and discharge, no lesions              Cervix: no cervical motion tenderness, no lesions and normal appearance              Pap taken: No. Bimanual Exam:  Uterus:  normal size, contour, position, consistency, mobility, non-tender and anteverted              Adnexa: normal adnexa and no mass, fullness, tenderness               Rectovaginal: Confirms               Anus:  normal sphincter tone, no lesions  Chaperone present: yes  A:  Well Woman with normal  exam  Contraception Mirena IUD due to removal 08/2022  Labs with PCP yearly    P:   Reviewed health and wellness pertinent to exam  Aware of warning signs and bleeding expectations with IUD.  Continue follow up with PCP as indicated  Pap smear: no   counseled on breast self exam, mammography screening, adequate intake of calcium and vitamin D, diet and exercise  return annually or prn  An After Visit Summary was printed and given to the patient.

## 2018-05-16 NOTE — Patient Instructions (Signed)

## 2018-07-07 ENCOUNTER — Ambulatory Visit (INDEPENDENT_AMBULATORY_CARE_PROVIDER_SITE_OTHER): Payer: BLUE CROSS/BLUE SHIELD

## 2018-07-07 ENCOUNTER — Ambulatory Visit (INDEPENDENT_AMBULATORY_CARE_PROVIDER_SITE_OTHER): Payer: BLUE CROSS/BLUE SHIELD | Admitting: Orthopaedic Surgery

## 2018-07-07 ENCOUNTER — Encounter (INDEPENDENT_AMBULATORY_CARE_PROVIDER_SITE_OTHER): Payer: Self-pay | Admitting: Orthopaedic Surgery

## 2018-07-07 DIAGNOSIS — G8929 Other chronic pain: Secondary | ICD-10-CM

## 2018-07-07 DIAGNOSIS — M25562 Pain in left knee: Secondary | ICD-10-CM

## 2018-07-07 NOTE — Progress Notes (Signed)
Office Visit Note   Patient: Phyllis Shelton           Date of Birth: September 22, 1975           MRN: [id number] Visit Date: 07/07/2018              Requested by: Midge Minium, MD [address] [place], [place] [address] PCP: Midge Minium, MD   Assessment & Plan: Visit Diagnoses:  1. Chronic pain of left knee     Plan: Impression is left knee lateral meniscus tear.  We will go ahead and obtain an MRI of the left knee to further assess this.  She will follow-up with [place] once that has been completed.  Call with concerns or questions in the meantime.  Follow-Up Instructions: Return in about 2 weeks (around 07/21/2018) for MRI review.   Orders:  Orders Placed This Encounter  Procedures  . XR KNEE 3 VIEW LEFT  . MR Knee Left w/o contrast   No orders of the defined types were placed in this encounter.     Procedures: No procedures performed   Clinical Data: No additional findings.   Subjective: Chief Complaint  Patient presents with  . Left Knee - Pain    HPI patient is a pleasant 42 year old female who presents to our clinic today with left knee pain.  This began several weeks ago while at boot camp.  She was jumping for period of time when she began to have pain to the lateral aspect.  She denies any left knee pain prior to this event.  Since then, she has had pain to the lateral aspect.  She notes occasional popping sensation and swelling.  Pain is worse with flexion, running and squatting.  She has been resting this and using a brace as well as doing exercises with her personal trainer without much relief of symptoms.  She is also been taking Advil without any relief.  Review of Systems as detailed in HPI.  All others reviewed and are negative.   Objective: Vital Signs: There were no vitals taken for this visit.    Physical Exam well-developed well-nourished female in no acute distress.  Alert and oriented x3.  Ortho Exam examination of the left knee shows  a trace effusion.  Range of motion 0 to 110 degrees.  She does have lateral joint line tenderness with a minimally positive lateral McMurray.  Cruciates and collaterals are stable.  Specialty Comments:  No specialty comments available.  Imaging: Xr Knee 3 View Left  Result Date: 07/07/2018 No acute or structural abnormalities    PMFS History: Patient Active Problem List   Diagnosis Date Noted  . Chronic pain of left knee 07/07/2018  . Adenomyosis 07/31/2017  . Menorrhagia with regular cycle 07/31/2017  . Vitamin D deficiency 11/18/2016  . Physical exam 11/12/2015  . Obesity (BMI 30-39.9) 07/25/2013  . 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   Past Medical History:  Diagnosis Date  . Heart palpitations    neg cardiology work-up  . Palpitations   . 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    Family History  Problem Relation Age of Onset  . Osteoporosis Mother        diagnosed at age 83  . Cancer Mother        thyroid  . Hypertension Father   . Aneurysm Father   . Alcohol abuse Father   . Dementia Maternal Grandmother   . Cancer Paternal Grandfather  stomach  . Diabetes Paternal Grandmother     Past Surgical History:  Procedure Laterality Date  . CESAREAN SECTION    . INTRAUTERINE DEVICE (IUD) INSERTION     09-15-17 inserted  . WISDOM TOOTH EXTRACTION     Social History   Occupational History  . Occupation: Product manager: [employer]  Tobacco Use  . Smoking status: Never Smoker  . Smokeless tobacco: Never Used  Substance and Sexual Activity  . Alcohol use: Yes    Alcohol/week: 1.0 - 3.0 standard drinks    Types: 1 - 3 Standard drinks or equivalent per week  . Drug use: No  . Sexual activity: Yes    Partners: Male    Birth control/protection: I.U.D.    Comment: husband vasectomy

## 2018-07-18 ENCOUNTER — Ambulatory Visit
Admission: RE | Admit: 2018-07-18 | Discharge: 2018-07-18 | Disposition: A | Discharge status: Home / Self Care | Payer: BLUE CROSS/BLUE SHIELD | Source: Ambulatory Visit | Attending: Orthopaedic Surgery | Admitting: Orthopaedic Surgery

## 2018-07-18 DIAGNOSIS — G8929 Other chronic pain: Secondary | ICD-10-CM

## 2018-07-18 DIAGNOSIS — M25562 Pain in left knee: Principal | ICD-10-CM

## 2018-07-18 DIAGNOSIS — M23342 Other meniscus derangements, anterior horn of lateral meniscus, left knee: Secondary | ICD-10-CM | POA: Diagnosis not present

## 2018-07-18 NOTE — Progress Notes (Signed)
Please have her come in for apt to review MRI

## 2018-07-25 ENCOUNTER — Encounter (INDEPENDENT_AMBULATORY_CARE_PROVIDER_SITE_OTHER): Payer: Self-pay | Admitting: Orthopaedic Surgery

## 2018-07-25 ENCOUNTER — Ambulatory Visit (INDEPENDENT_AMBULATORY_CARE_PROVIDER_SITE_OTHER): Payer: BLUE CROSS/BLUE SHIELD | Admitting: Orthopaedic Surgery

## 2018-07-25 DIAGNOSIS — S83282A Other tear of lateral meniscus, current injury, left knee, initial encounter: Secondary | ICD-10-CM | POA: Insufficient documentation

## 2018-07-25 DIAGNOSIS — S83282D Other tear of lateral meniscus, current injury, left knee, subsequent encounter: Secondary | ICD-10-CM | POA: Diagnosis not present

## 2018-07-25 DIAGNOSIS — L02224 Furuncle of groin: Secondary | ICD-10-CM | POA: Diagnosis not present

## 2018-07-25 MED ORDER — BUPIVACAINE HCL 0.25 % IJ SOLN
2.0000 mL | INTRAMUSCULAR | Status: AC | PRN
  Administered 2018-07-25: 2 mL via INTRA_ARTICULAR

## 2018-07-25 MED ORDER — LIDOCAINE HCL 1 % IJ SOLN
2.0000 mL | INTRAMUSCULAR | Status: AC | PRN
  Administered 2018-07-25: 2 mL

## 2018-07-25 MED ORDER — METHYLPREDNISOLONE ACETATE 40 MG/ML IJ SUSP
40.0000 mg | INTRAMUSCULAR | Status: AC | PRN
  Administered 2018-07-25: 40 mg via INTRA_ARTICULAR

## 2018-07-25 NOTE — Progress Notes (Signed)
Office Visit Note   Patient: Phyllis Shelton           Date of Birth: [birth year]           MRN: [id number] Visit Date: 07/25/2018              Requested by: Midge Minium, MD [address] [place], [place] [address] PCP: Midge Minium, MD   Assessment & Plan: Visit Diagnoses:  1. Tear of lateral meniscus of left knee, unspecified tear type, unspecified whether old or current tear, subsequent encounter     Plan: Impression is left knee lateral meniscocapsular tear.  This should be amenable to conservative treatment with cortisone injection.  She will avoid squats, pivoting and ballistic activities for the next 6 weeks.  Cortisone injection performed today.  Follow-up in 6 weeks time for recheck.  Follow-Up Instructions: Return in about 6 weeks (around 09/05/2018).   Orders:  Orders Placed This Encounter  Procedures  . Large Joint Inj: L knee   No orders of the defined types were placed in this encounter.     Procedures: Large Joint Inj: L knee on 07/25/2018 2:55 PM Indications: pain Details: 22 G needle, anterolateral approach Medications: 2 mL lidocaine 1 %; 2 mL bupivacaine 0.25 %; 40 mg methylPREDNISolone acetate 40 MG/ML      Clinical Data: No additional findings.   Subjective: Chief Complaint  Patient presents with  . Left Knee - Follow-up    HPI patient is a pleasant 43 year old female who presents to our clinic today discuss MRI results of her left knee.  The problem began several weeks ago when jumping for a period of time while at boot camp.  She then began to have pain the lateral aspect of her knee.  MRI obtained which showed a radial tear lateral meniscus as well as a meniscocapsular tear just deep to the IT band.  Patient continues to have symptoms  Review of Systems as detailed in HPI.  All others reviewed and are negative.   Objective: Vital Signs: There were no vitals taken for this visit.  Physical Exam well-developed  well-nourished female no acute distress.  Alert and oriented x3.  Ortho Exam stable knee exam  Specialty Comments:  No specialty comments available.  Imaging: No new imaging   PMFS History: Patient Active Problem List   Diagnosis Date Noted  . Tear of lateral meniscus of left knee 07/25/2018  . Chronic pain of left knee 07/07/2018  . Adenomyosis 07/31/2017  . Menorrhagia with regular cycle 07/31/2017  . Vitamin D deficiency 11/18/2016  . Physical exam 11/12/2015  . Obesity (BMI 30-39.9) 07/25/2013  . 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   Past Medical History:  Diagnosis Date  . Heart palpitations    neg cardiology work-up  . Palpitations   . 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    Family History  Problem Relation Age of Onset  . Osteoporosis Mother        diagnosed at age 17  . Cancer Mother        thyroid  . Hypertension Father   . Aneurysm Father   . Alcohol abuse Father   . Dementia Maternal Grandmother   . Cancer Paternal Grandfather        stomach  . Diabetes Paternal Grandmother     Past Surgical History:  Procedure Laterality Date  . CESAREAN SECTION    . INTRAUTERINE DEVICE (IUD) INSERTION     09-15-17 inserted  . WISDOM TOOTH  EXTRACTION     Social History   Occupational History  . Occupation: Product manager: [employer]  Tobacco Use  . Smoking status: Never Smoker  . Smokeless tobacco: Never Used  Substance and Sexual Activity  . Alcohol use: Yes    Alcohol/week: 1.0 - 3.0 standard drinks    Types: 1 - 3 Standard drinks or equivalent per week  . Drug use: No  . Sexual activity: Yes    Partners: Male    Birth control/protection: I.U.D.    Comment: husband vasectomy

## 2018-08-25 ENCOUNTER — Encounter (INDEPENDENT_AMBULATORY_CARE_PROVIDER_SITE_OTHER): Payer: Self-pay | Admitting: Orthopaedic Surgery

## 2018-09-05 ENCOUNTER — Ambulatory Visit (INDEPENDENT_AMBULATORY_CARE_PROVIDER_SITE_OTHER): Payer: BLUE CROSS/BLUE SHIELD | Admitting: Orthopaedic Surgery

## 2018-09-07 ENCOUNTER — Other Ambulatory Visit: Payer: Self-pay | Admitting: Certified Nurse Midwife

## 2018-09-07 DIAGNOSIS — Z1231 Encounter for screening mammogram for malignant neoplasm of breast: Secondary | ICD-10-CM

## 2018-09-17 DIAGNOSIS — M25562 Pain in left knee: Secondary | ICD-10-CM | POA: Diagnosis not present

## 2018-09-17 DIAGNOSIS — S8992XA Unspecified injury of left lower leg, initial encounter: Secondary | ICD-10-CM | POA: Diagnosis not present

## 2018-09-19 ENCOUNTER — Ambulatory Visit (INDEPENDENT_AMBULATORY_CARE_PROVIDER_SITE_OTHER): Payer: BLUE CROSS/BLUE SHIELD | Admitting: Orthopaedic Surgery

## 2018-09-19 ENCOUNTER — Encounter (INDEPENDENT_AMBULATORY_CARE_PROVIDER_SITE_OTHER): Payer: Self-pay | Admitting: Orthopaedic Surgery

## 2018-09-19 ENCOUNTER — Ambulatory Visit (INDEPENDENT_AMBULATORY_CARE_PROVIDER_SITE_OTHER): Payer: BLUE CROSS/BLUE SHIELD

## 2018-09-19 DIAGNOSIS — G8929 Other chronic pain: Secondary | ICD-10-CM

## 2018-09-19 DIAGNOSIS — S83282D Other tear of lateral meniscus, current injury, left knee, subsequent encounter: Secondary | ICD-10-CM

## 2018-09-19 DIAGNOSIS — M25562 Pain in left knee: Secondary | ICD-10-CM

## 2018-09-19 MED ORDER — LIDOCAINE HCL 1 % IJ SOLN
2.0000 mL | INTRAMUSCULAR | Status: AC | PRN
  Administered 2018-09-19: 2 mL

## 2018-09-19 MED ORDER — KETOROLAC TROMETHAMINE 15 MG/ML IJ SOLN: 15.0000 mg | Freq: Once | INTRAMUSCULAR | Status: AC

## 2018-09-19 MED ORDER — BUPIVACAINE HCL 0.25 % IJ SOLN
2.0000 mL | INTRAMUSCULAR | Status: AC | PRN
  Administered 2018-09-19: 2 mL via INTRA_ARTICULAR

## 2018-09-19 NOTE — Progress Notes (Signed)
Office Visit Note   Patient: Phyllis Shelton           Date of Birth: [birth year]           MRN: [id number] Visit Date: 09/19/2018              Requested by: Phyllis Minium, MD [address] [place], [place] [address] PCP: Phyllis Minium, MD   Assessment & Plan: Visit Diagnoses:  1. Tear of lateral meniscus of left knee, unspecified tear type, unspecified whether old or current tear, subsequent encounter   2. Chronic pain of left knee   3. Acute pain of left knee     Plan: Impression is left knee pain and lateral meniscocapsular tear.  I injected the left knee with Toradol today.  She will continue to take it easy over the next several weeks and avoid boot camp and specifically pivoting exercises.  She will follow-up with [place] in 4 weeks time for recheck.  Should she have significant provement of symptoms, she will call and cancel the appointment.  Follow-Up Instructions: Return in about 4 weeks (around 10/17/2018).   Orders:  Orders Placed This Encounter  Procedures  . Large Joint Inj: L knee  . XR KNEE 3 VIEW LEFT   Meds ordered this encounter  Medications  . ketorolac (TORADOL) 15 MG/ML injection 15 mg      Procedures: Large Joint Inj: L knee on 09/19/2018 9:02 AM Indications: pain Details: 22 G needle, anterolateral approach Medications: 2 mL lidocaine 1 %; 2 mL bupivacaine 0.25 %      Clinical Data: No additional findings.   Subjective: Chief Complaint  Patient presents with  . Left Knee - Pain    HPI patient is a pleasant 43 year old female who presents our clinic today with a new injury to her left knee.  Recent history of left knee lateral meniscocapsular tear.  We proceeded with a cortisone injection on 07/25/2018 which significantly helped for 4 weeks.  New injury occurred this past Sunday when she pivoted with her left knee and had immediate pain to the lateral aspect.  Pain is worse with flexion of the knee.  No locking or catching.  She was  seen in urgent care setting where she was given an IM Toradol injection as well as p.o. Toradol.  She has had minimal relief of symptoms.  Review of Systems as detailed in HPI.  All others reviewed and are negative.   Objective: Vital Signs: There were no vitals taken for this visit.  Physical Exam well-developed and well-nourished female no acute distress.  Alert and oriented x3.  Ortho Exam examination of the left knee is range of motion 0 to 110 degrees.  Lateral joint line tenderness with a positive lateral McMurray.  He is stable to valgus varus stress.  Specialty Comments:  No specialty comments available.  Imaging: Xr Knee 3 View Left  Result Date: 09/19/2018 No acute or structural abnormalities    PMFS History: Patient Active Problem List   Diagnosis Date Noted  . Tear of lateral meniscus of left knee 07/25/2018  . Chronic pain of left knee 07/07/2018  . Adenomyosis 07/31/2017  . Menorrhagia with regular cycle 07/31/2017  . Vitamin D deficiency 11/18/2016  . Physical exam 11/12/2015  . Obesity (BMI 30-39.9) 07/25/2013  . 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   Past Medical History:  Diagnosis Date  . Heart palpitations    neg cardiology work-up  . Palpitations   . PREMENSTRUAL  DYSPHORIC SYNDROME 10/30/2008    Family History  Problem Relation Age of Onset  . Osteoporosis Mother        diagnosed at age 82  . Cancer Mother        thyroid  . Hypertension Father   . Aneurysm Father   . Alcohol abuse Father   . Dementia Maternal Grandmother   . Cancer Paternal Grandfather        stomach  . Diabetes Paternal Grandmother     Past Surgical History:  Procedure Laterality Date  . CESAREAN SECTION    . INTRAUTERINE DEVICE (IUD) INSERTION     09-15-17 inserted  . WISDOM TOOTH EXTRACTION     Social History   Occupational History  . Occupation: Product manager: [employer]  Tobacco Use  . Smoking status: Never Smoker  . Smokeless tobacco: Never Used    Substance and Sexual Activity  . Alcohol use: Yes    Alcohol/week: 1.0 - 3.0 standard drinks    Types: 1 - 3 Standard drinks or equivalent per week  . Drug use: No  . Sexual activity: Yes    Partners: Male    Birth control/protection: I.U.D.    Comment: husband vasectomy

## 2018-09-26 DIAGNOSIS — F4323 Adjustment disorder with mixed anxiety and depressed mood: Secondary | ICD-10-CM | POA: Diagnosis not present

## 2018-10-02 ENCOUNTER — Ambulatory Visit (INDEPENDENT_AMBULATORY_CARE_PROVIDER_SITE_OTHER): Payer: BLUE CROSS/BLUE SHIELD | Admitting: Family Medicine

## 2018-10-02 ENCOUNTER — Encounter: Payer: Self-pay | Admitting: Family Medicine

## 2018-10-02 ENCOUNTER — Other Ambulatory Visit: Payer: Self-pay

## 2018-10-02 VITALS — BP 122/76 | HR 78 | Temp 98.3°F | Resp 16 | Ht 59.0 in | Wt 167.1 lb

## 2018-10-02 DIAGNOSIS — R82998 Other abnormal findings in urine: Secondary | ICD-10-CM | POA: Diagnosis not present

## 2018-10-02 DIAGNOSIS — R300 Dysuria: Secondary | ICD-10-CM

## 2018-10-02 LAB — POCT URINALYSIS DIPSTICK
Bilirubin, UA: NEGATIVE
Blood, UA: NEGATIVE
Glucose, UA: NEGATIVE
Ketones, UA: NEGATIVE
Nitrite, UA: POSITIVE
Protein, UA: NEGATIVE
Spec Grav, UA: <=1.005 — AB (ref 1.010–1.025)
Urobilinogen, UA: 1 E.U./dL
pH, UA: 6 (ref 5.0–8.0)

## 2018-10-02 MED ORDER — CEPHALEXIN 500 MG PO CAPS: 500.0000 mg | ORAL_CAPSULE | Freq: Two times a day (BID) | ORAL | 0 refills | Status: DC

## 2018-10-02 NOTE — Progress Notes (Signed)
   Subjective:    Patient ID: Phyllis Shelton, female    DOB: July 19, 1976, 43 y.o.   MRN: [id number]  HPI UTI- sxs started Saturday AM.  Burning w/ urination, increased frequency, some hesitancy and incomplete emptying.  No fever.  No N/V.  Taking AZO   Review of Systems For ROS see HPI     Objective:   Physical Exam Vitals signs reviewed.  Constitutional:      General: She is not in acute distress.    Appearance: Normal appearance. She is well-developed.  Abdominal:     General: There is no distension.     Palpations: Abdomen is soft.     Tenderness: There is no abdominal tenderness (no suprapubic or CVA tenderness).  Neurological:     General: No focal deficit present.     Mental Status: She is alert and oriented to person, place, and time.  Psychiatric:        Mood and Affect: Mood normal.        Behavior: Behavior normal.        Thought Content: Thought content normal.           Assessment & Plan:  Dysuria- new.  Pt's sxs and UA consistent w/ infxn.  Start abx.  Reviewed supportive care and red flags that should prompt return.  Pt expressed understanding and is in agreement w/ plan.

## 2018-10-02 NOTE — Patient Instructions (Signed)
Follow up as needed or as scheduled START the Cephalexin twice daily DRINK LOTS of fluids Call with any questions or concerns Stay Safe!!!

## 2018-10-02 NOTE — Addendum Note (Signed)
Addended by: Doran Clay A on: 10/02/2018 11:34 AM   Modules accepted: Orders

## 2018-10-02 NOTE — Addendum Note (Signed)
Addended by: Davis Gourd on: 10/02/2018 11:33 AM   Modules accepted: Orders

## 2018-10-04 ENCOUNTER — Other Ambulatory Visit: Payer: Self-pay | Admitting: General Practice

## 2018-10-04 LAB — URINE CULTURE
MICRO NUMBER:: 326237
SPECIMEN QUALITY:: ADEQUATE

## 2018-10-04 MED ORDER — CIPROFLOXACIN HCL 500 MG PO TABS: 500.0000 mg | ORAL_TABLET | Freq: Two times a day (BID) | ORAL | 0 refills | Status: DC

## 2018-10-05 ENCOUNTER — Inpatient Hospital Stay: Admission: RE | Admit: 2018-10-05 | Payer: BLUE CROSS/BLUE SHIELD | Source: Ambulatory Visit

## 2018-10-17 ENCOUNTER — Ambulatory Visit (INDEPENDENT_AMBULATORY_CARE_PROVIDER_SITE_OTHER): Payer: BLUE CROSS/BLUE SHIELD | Admitting: Orthopaedic Surgery

## 2018-11-08 ENCOUNTER — Ambulatory Visit (INDEPENDENT_AMBULATORY_CARE_PROVIDER_SITE_OTHER): Payer: BLUE CROSS/BLUE SHIELD | Admitting: Family Medicine

## 2018-11-08 ENCOUNTER — Encounter: Payer: Self-pay | Admitting: Family Medicine

## 2018-11-08 ENCOUNTER — Other Ambulatory Visit: Payer: Self-pay

## 2018-11-08 VITALS — BP 122/91 | Temp 98.7°F | Ht 59.0 in | Wt 162.0 lb

## 2018-11-08 DIAGNOSIS — B9689 Other specified bacterial agents as the cause of diseases classified elsewhere: Secondary | ICD-10-CM

## 2018-11-08 DIAGNOSIS — J329 Chronic sinusitis, unspecified: Secondary | ICD-10-CM | POA: Diagnosis not present

## 2018-11-08 MED ORDER — AMOXICILLIN 875 MG PO TABS: 875.0000 mg | ORAL_TABLET | Freq: Two times a day (BID) | ORAL | 0 refills | Status: DC

## 2018-11-08 MED ORDER — FLUTICASONE PROPIONATE 50 MCG/ACT NA SUSP: 2.0000 | Freq: Every day | NASAL | 6 refills | Status: DC

## 2018-11-08 NOTE — Progress Notes (Signed)
I have discussed the procedure for the virtual visit with the patient who has given consent to proceed with assessment and treatment.   BETHANY DILLARD, CMA     

## 2018-11-08 NOTE — Progress Notes (Signed)
   Virtual Visit via Video   I connected with patient on 11/08/18 at  9:40 AM EDT by a video enabled telemedicine application and verified that I am speaking with the correct person using two identifiers.  Location patient: Home Location provider: [facility], Office Persons participating in the virtual visit: Patient, Provider, [place] ([place])  I discussed the limitations of evaluation and management by telemedicine and the availability of in person appointments. The patient expressed understanding and agreed to proceed.  Subjective:   HPI:  URI- 'I think I have a sinus infection'.  Pt initially thought she had COVID until Monday- decreased appetite, cough, HA, exhaustion.  Monday developed sinus HA, facial pressure, ear pain and fullness w/ decreased hearing.  'a lot of drainage'.  No fever.  ROS:   See pertinent positives and negatives per HPI.  Patient Active Problem List   Diagnosis Date Noted  . Tear of lateral meniscus of left knee 07/25/2018  . Chronic pain of left knee 07/07/2018  . Adenomyosis 07/31/2017  . Menorrhagia with regular cycle 07/31/2017  . Vitamin D deficiency 11/18/2016  . Obesity (BMI 30-39.9) 07/25/2013  . 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    Social History   Tobacco Use  . Smoking status: Never Smoker  . Smokeless tobacco: Never Used  Substance Use Topics  . Alcohol use: Yes    Alcohol/week: 1.0 - 3.0 standard drinks    Types: 1 - 3 Standard drinks or equivalent per week    Current Outpatient Medications:  .  levonorgestrel (MIRENA) 20 MCG/24HR IUD, 1 each by Intrauterine route once. Placed 09/15/17, Disp: , Rfl:  .  ciprofloxacin (CIPRO) 500 MG tablet, Take 1 tablet (500 mg total) by mouth 2 (two) times daily., Disp: 6 tablet, Rfl: 0  Allergies  Allergen Reactions  . Darvocet [Propoxyphene N-Acetaminophen] Other (See Comments)    Pt states causes her to feel "loopy", hallucinations    Objective:   BP (!) 122/91    Temp 98.7 F (37.1 C)   Ht 4\' 11"  (1.499 m)   Wt 162 lb (73.5 kg)   BMI 32.72 kg/m  AAOx3, NAD NCAT, EOMI No obvious CN deficits Coloring WNL Pt is able to speak clearly, coherently without shortness of breath or increased work of breathing.  Thought process is linear.  Mood is appropriate.    Assessment and Plan:   Sinusitis- new.  Given pt's sxs and hx of similar suspect sinus infection w/ possible ear infection.  We did discuss the possibility of COVID and that it was important to isolate herself and monitor her sxs.  She is aware to notify me if things are not improving on abx or at anytime worsening.   Annye Asa, MD 11/08/2018

## 2018-11-24 ENCOUNTER — Encounter: Payer: BLUE CROSS/BLUE SHIELD | Admitting: Family Medicine

## 2018-11-24 DIAGNOSIS — Z114 Encounter for screening for human immunodeficiency virus [HIV]: Secondary | ICD-10-CM | POA: Diagnosis not present

## 2018-11-24 DIAGNOSIS — Z113 Encounter for screening for infections with a predominantly sexual mode of transmission: Secondary | ICD-10-CM | POA: Diagnosis not present

## 2018-12-18 ENCOUNTER — Ambulatory Visit
Admission: RE | Admit: 2018-12-18 | Discharge: 2018-12-18 | Disposition: A | Discharge status: Home / Self Care | Payer: BLUE CROSS/BLUE SHIELD | Source: Ambulatory Visit | Attending: Certified Nurse Midwife | Admitting: Certified Nurse Midwife

## 2018-12-18 ENCOUNTER — Other Ambulatory Visit: Payer: Self-pay

## 2018-12-18 DIAGNOSIS — Z1231 Encounter for screening mammogram for malignant neoplasm of breast: Secondary | ICD-10-CM | POA: Diagnosis not present

## 2019-02-07 ENCOUNTER — Encounter: Payer: Self-pay | Admitting: Family Medicine

## 2019-02-07 ENCOUNTER — Other Ambulatory Visit: Payer: Self-pay

## 2019-02-07 ENCOUNTER — Ambulatory Visit (INDEPENDENT_AMBULATORY_CARE_PROVIDER_SITE_OTHER): Payer: BC Managed Care – PPO | Admitting: Family Medicine

## 2019-02-07 DIAGNOSIS — F419 Anxiety disorder, unspecified: Secondary | ICD-10-CM | POA: Diagnosis not present

## 2019-02-07 DIAGNOSIS — F32A Depression, unspecified: Secondary | ICD-10-CM | POA: Insufficient documentation

## 2019-02-07 DIAGNOSIS — F329 Major depressive disorder, single episode, unspecified: Secondary | ICD-10-CM | POA: Diagnosis not present

## 2019-02-07 DIAGNOSIS — R0683 Snoring: Secondary | ICD-10-CM | POA: Diagnosis not present

## 2019-02-07 MED ORDER — SERTRALINE HCL 25 MG PO TABS: 25.0000 mg | ORAL_TABLET | Freq: Every day | ORAL | 3 refills | Status: DC

## 2019-02-07 NOTE — Progress Notes (Signed)
I have discussed the procedure for the virtual visit with the patient who has given consent to proceed with assessment and treatment.   Unable to obtain vitals.   Pt complains of trouble staying asleep wakes up several times a night several times a week. States she is not sure it is anxiety or something else. Pt husband complains of heavy snoring. Melatonin helps her sleep when she takes it.   Davis Gourd, CMA

## 2019-02-07 NOTE — Progress Notes (Signed)
   Virtual Visit via Video   I connected with patient on 02/07/19 at 10:30 AM EDT by a video enabled telemedicine application and verified that I am speaking with the correct person using two identifiers.  Location patient: Home Location provider: [facility], Office Persons participating in the virtual visit: Patient, Provider, [place] (Jess B)  I discussed the limitations of evaluation and management by telemedicine and the availability of in person appointments. The patient expressed understanding and agreed to proceed.  Subjective:   HPI:  Sleep issues- pt wonders if this is anxiety related but husband also reports loud snoring.  Has not specifically mentioned breathing pauses.  Difficulty staying asleep.  Able to fall asleep quickly but is typically waking by 4am.  Taking Melatonin.  Pt is waking feeling exhausted.  Finds herself wanting to go back to bed by lunch.    Anxiety- worrying about what they're going to do about school, wondering if she's ever going to go back to work.  'day to day wondering what we're going to do'.  Has scheduled a few days away in a cabin w/ daughter.  Daughter is currently 'struggling with a mental health issue'.  Dealing w/ bulimia and self harm.  Pt has a therapist- she has not been seeing since [place] started.  Pt has had a panic attack.    ROS:   See pertinent positives and negatives per HPI.  Patient Active Problem List   Diagnosis Date Noted  . Tear of lateral meniscus of left knee 07/25/2018  . Chronic pain of left knee 07/07/2018  . Adenomyosis 07/31/2017  . Menorrhagia with regular cycle 07/31/2017  . Vitamin D deficiency 11/18/2016  . Obesity (BMI 30-39.9) 07/25/2013  . 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    Social History   Tobacco Use  . Smoking status: Never Smoker  . Smokeless tobacco: Never Used  Substance Use Topics  . Alcohol use: Yes    Alcohol/week: 1.0 - 3.0 standard drinks    Types: 1 - 3 Standard drinks or  equivalent per week    Current Outpatient Medications:  .  levonorgestrel (MIRENA) 20 MCG/24HR IUD, 1 each by Intrauterine route once. Placed 09/15/17, Disp: , Rfl:  .  fluticasone (FLONASE) 50 MCG/ACT nasal spray, Place 2 sprays into both nostrils daily. (Patient not taking: Reported on 02/07/2019), Disp: 16 g, Rfl: 6  Allergies  Allergen Reactions  . Darvocet [Propoxyphene N-Acetaminophen] Other (See Comments)    Pt states causes her to feel "loopy", hallucinations    Objective:   There were no vitals taken for this visit.  AAOx3, NAD NCAT, EOMI No obvious CN deficits Coloring WNL Pt is able to speak clearly, coherently without shortness of breath or increased work of breathing.  Thought process is linear.  Mood is appropriate.   Assessment and Plan:   Snoring- new.  Pt's husband is concerned for possible sleep apnea.  Pt is waking feeling exhausted and ready to go back to bed by noon.  Will refer to Pulmonary for a work up.  Anxiety/depression- new.  May be the cause of her early morning awakening.  She plans on calling her counselor to re-establish to discuss what is going on w/ her daughter.  Pt is agreeable to starting low dose medication.  Reviewed possible side effects and proper use.  Will start Sertraline and monitor closely for improvement.   Annye Asa, MD 02/07/2019

## 2019-02-09 DIAGNOSIS — F4323 Adjustment disorder with mixed anxiety and depressed mood: Secondary | ICD-10-CM | POA: Diagnosis not present

## 2019-02-16 DIAGNOSIS — F4323 Adjustment disorder with mixed anxiety and depressed mood: Secondary | ICD-10-CM | POA: Diagnosis not present

## 2019-02-21 ENCOUNTER — Encounter: Payer: BLUE CROSS/BLUE SHIELD | Admitting: Family Medicine

## 2019-02-23 DIAGNOSIS — F4323 Adjustment disorder with mixed anxiety and depressed mood: Secondary | ICD-10-CM | POA: Diagnosis not present

## 2019-02-26 ENCOUNTER — Other Ambulatory Visit: Payer: Self-pay

## 2019-02-26 ENCOUNTER — Encounter: Payer: Self-pay | Admitting: Internal Medicine

## 2019-02-26 ENCOUNTER — Ambulatory Visit (INDEPENDENT_AMBULATORY_CARE_PROVIDER_SITE_OTHER): Payer: BC Managed Care – PPO | Admitting: Internal Medicine

## 2019-02-26 VITALS — BP 124/86 | HR 74 | Temp 98.6°F | Ht 59.0 in | Wt 170.0 lb

## 2019-02-26 DIAGNOSIS — E669 Obesity, unspecified: Secondary | ICD-10-CM | POA: Diagnosis not present

## 2019-02-26 DIAGNOSIS — G4733 Obstructive sleep apnea (adult) (pediatric): Secondary | ICD-10-CM | POA: Diagnosis not present

## 2019-02-26 DIAGNOSIS — G4710 Hypersomnia, unspecified: Secondary | ICD-10-CM | POA: Insufficient documentation

## 2019-02-26 DIAGNOSIS — R0683 Snoring: Secondary | ICD-10-CM

## 2019-02-26 NOTE — Assessment & Plan Note (Signed)
Impact on OSA and other health problems so would be benefit from effort

## 2019-02-26 NOTE — Progress Notes (Signed)
02/26/2019- 43 yo married F never smoker for sleep evaluation: referred by Dr. Birdie Riddle (PCP) for snoring, pt denies historical sleep study, denies CPAP use Body weight today 170 lbs Epworth score 11 Medical problem list includes Obesity, anxiety and depresion For at least 6 months has  Been told of loud snoring, frequent waking, witnessed apneas. Tired during the day but not enough to afect driving. Can doze off during tv. Melatonin at bedtime, 1-2 cups coffee in AM. Denies ENT surgery, known neuro, heart of lung problems.  Prior to Admission medications   Medication Sig Start Date End Date Taking? Authorizing Provider  levonorgestrel (MIRENA) 20 MCG/24HR IUD 1 each by Intrauterine route once. Placed 09/15/17   Yes [provider]  sertraline (ZOLOFT) 25 MG tablet Take 1 tablet (25 mg total) by mouth daily. 02/07/19  Yes Midge Minium, MD  fluticasone (FLONASE) 50 MCG/ACT nasal spray Place 2 sprays into both nostrils daily. Patient not taking: Reported on 02/07/2019 11/08/18   Midge Minium, MD   Past Medical History:  Diagnosis Date  . Heart palpitations    neg cardiology work-up  . Palpitations   . 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   Past Surgical History:  Procedure Laterality Date  . CESAREAN SECTION    . INTRAUTERINE DEVICE (IUD) INSERTION     09-15-17 inserted  . WISDOM TOOTH EXTRACTION     Family History  Problem Relation Age of Onset  . Osteoporosis Mother        diagnosed at age 71  . Cancer Mother        thyroid  . Hypertension Father   . Aneurysm Father   . Alcohol abuse Father   . Dementia Maternal Grandmother   . Cancer Paternal Grandfather        stomach  . Diabetes Paternal Grandmother    Social History   Socioeconomic History  . Marital status: Married    Spouse name: Not on file  . Number of children: Not on file  . Years of education: Not on file  . Highest education level: Not on file  Occupational History  . Occupation:  Product manager: [employer]  . Financial resource strain: Not on file  . Food insecurity    Worry: Not on file    Inability: Not on file  . Transportation needs    Medical: Not on file    Non-medical: Not on file  Tobacco Use  . Smoking status: Never Smoker  . Smokeless tobacco: Never Used  Substance and Sexual Activity  . Alcohol use: Yes    Alcohol/week: 1.0 - 3.0 standard drinks    Types: 1 - 3 Standard drinks or equivalent per week  . Drug use: No  . Sexual activity: Yes    Partners: Male    Birth control/protection: I.U.D.    Comment: husband vasectomy  Lifestyle  . Physical activity    Days per week: Not on file    Minutes per session: Not on file  . Stress: Not on file  Relationships  . Social Herbalist on phone: Not on file    Gets together: Not on file    Attends religious service: Not on file    Active member of club or organization: Not on file    Attends meetings of clubs or organizations: Not on file    Relationship status: Not on file  . Intimate partner violence    Fear of current or ex  partner: Not on file    Emotionally abused: Not on file    Physically abused: Not on file    Forced sexual activity: Not on file  Other Topics Concern  . Not on file  Social History Narrative  . Not on file   ROS-see HPI   + = positive Constitutional:    weight loss, night sweats, fevers, chills, fatigue, lassitude. HEENT:    headaches, difficulty swallowing, tooth/dental problems, sore throat,       sneezing, itching, ear ache, nasal congestion, post nasal drip, snoring CV:    chest pain, orthopnea, PND, swelling in lower extremities, anasarca,                                  dizziness, palpitations Resp:   shortness of breath with exertion or at rest.                productive cough,   non-productive cough, coughing up of blood.              change in color of mucus.  wheezing.   Skin:    rash or lesions. GI:  + heartburn, indigestion,  abdominal pain, nausea, vomiting, diarrhea,                 change in bowel habits, loss of appetite GU: dysuria, change in color of urine, no urgency or frequency.   flank pain. MS:   joint pain, stiffness, decreased range of motion, back pain. Neuro-     nothing unusual Psych:  change in mood or affect.  +depression or +anxiety.   memory loss.  OBJ- Physical Exam General- Alert, Oriented, Affect-appropriate, Distress- none acute, + obese Skin- rash-none, lesions- none, excoriation- none Lymphadenopathy- none Head- atraumatic            Eyes- Gross vision intact, PERRLA, conjunctivae and secretions clear            Ears- Hearing, canals-normal            Nose- Clear, no-Septal dev, mucus, polyps, erosion, perforation             Throat- Mallampati IV , mucosa clear , drainage- none, tonsils- atrophic Neck- flexible , trachea midline, no stridor , thyroid nl, carotid no bruit Chest - symmetrical excursion , unlabored           Heart/CV- RRR , no murmur , no gallop  , no rub, nl s1 s2                           - JVD- none , edema- none, stasis changes- none, varices- none           Lung- clear to P&A, wheeze- none, cough- none , dullness-none, rub- none           Chest wall-  Abd-  Br/ Gen/ Rectal- Not done, not indicated Extrem- cyanosis- none, clubbing, none, atrophy- none, strength- nl Neuro- grossly intact to observation

## 2019-02-26 NOTE — Assessment & Plan Note (Signed)
Probable obstructive apnea. Discussion included basic sleep hygiene, mechanism of OSA, testing, common treatments, driving safety. Plan- sleep study.

## 2019-02-26 NOTE — Patient Instructions (Signed)
Order- please schedule unattended home sleep test   Dx OSA  Please call about 2 weeks after your sleep study to see if results and recommendations are ready yet. If appropriate, we maybe able to start treatment before we see you next.

## 2019-03-08 DIAGNOSIS — F4323 Adjustment disorder with mixed anxiety and depressed mood: Secondary | ICD-10-CM | POA: Diagnosis not present

## 2019-03-21 ENCOUNTER — Encounter: Payer: Self-pay | Admitting: Family Medicine

## 2019-03-21 ENCOUNTER — Ambulatory Visit (INDEPENDENT_AMBULATORY_CARE_PROVIDER_SITE_OTHER): Payer: BC Managed Care – PPO | Admitting: Family Medicine

## 2019-03-21 ENCOUNTER — Other Ambulatory Visit: Payer: Self-pay

## 2019-03-21 VITALS — BP 120/72 | HR 73 | Temp 98.1°F | Resp 16 | Ht 59.0 in | Wt 172.1 lb

## 2019-03-21 DIAGNOSIS — F329 Major depressive disorder, single episode, unspecified: Secondary | ICD-10-CM | POA: Diagnosis not present

## 2019-03-21 DIAGNOSIS — E559 Vitamin D deficiency, unspecified: Secondary | ICD-10-CM | POA: Diagnosis not present

## 2019-03-21 DIAGNOSIS — F419 Anxiety disorder, unspecified: Secondary | ICD-10-CM

## 2019-03-21 DIAGNOSIS — F32A Depression, unspecified: Secondary | ICD-10-CM

## 2019-03-21 DIAGNOSIS — Z0000 Encounter for general adult medical examination without abnormal findings: Secondary | ICD-10-CM

## 2019-03-21 DIAGNOSIS — Z23 Encounter for immunization: Secondary | ICD-10-CM | POA: Diagnosis not present

## 2019-03-21 DIAGNOSIS — E669 Obesity, unspecified: Secondary | ICD-10-CM

## 2019-03-21 LAB — LIPID PANEL
Cholesterol: 175 mg/dL (ref 0–200)
HDL: 49.1 mg/dL
LDL Cholesterol: 107 mg/dL — ABNORMAL HIGH (ref 0–99)
NonHDL: 125.91
Total CHOL/HDL Ratio: 4
Triglycerides: 93 mg/dL (ref 0.0–149.0)
VLDL: 18.6 mg/dL (ref 0.0–40.0)

## 2019-03-21 LAB — CBC WITH DIFFERENTIAL/PLATELET
Basophils Absolute: 0.1 10*3/uL (ref 0.0–0.1)
Basophils Relative: 0.7 % (ref 0.0–3.0)
Eosinophils Absolute: 0.2 10*3/uL (ref 0.0–0.7)
Eosinophils Relative: 2.4 % (ref 0.0–5.0)
HCT: 41.9 % (ref 36.0–46.0)
Hemoglobin: 14.5 g/dL (ref 12.0–15.0)
Lymphocytes Relative: 24.5 % (ref 12.0–46.0)
Lymphs Abs: 2 10*3/uL (ref 0.7–4.0)
MCHC: 34.6 g/dL (ref 30.0–36.0)
MCV: 89.8 fl (ref 78.0–100.0)
Monocytes Absolute: 0.7 10*3/uL (ref 0.1–1.0)
Monocytes Relative: 8.6 % (ref 3.0–12.0)
Neutro Abs: 5.1 10*3/uL (ref 1.4–7.7)
Neutrophils Relative %: 63.8 % (ref 43.0–77.0)
Platelets: 225 10*3/uL (ref 150.0–400.0)
RBC: 4.66 Mil/uL (ref 3.87–5.11)
RDW: 13 % (ref 11.5–15.5)
WBC: 8.1 10*3/uL (ref 4.0–10.5)

## 2019-03-21 LAB — BASIC METABOLIC PANEL
BUN: 15 mg/dL (ref 6–23)
CO2: 26 mEq/L (ref 19–32)
Calcium: 9.6 mg/dL (ref 8.4–10.5)
Chloride: 102 mEq/L (ref 96–112)
Creatinine, Ser: 0.86 mg/dL (ref 0.40–1.20)
GFR: 71.2 mL/min (ref >60.00)
Glucose, Bld: 84 mg/dL (ref 70–99)
Potassium: 4.5 mEq/L (ref 3.5–5.1)
Sodium: 137 mEq/L (ref 135–145)

## 2019-03-21 LAB — TSH: TSH: 1.2 u[IU]/mL (ref 0.35–4.50)

## 2019-03-21 LAB — HEPATIC FUNCTION PANEL
ALT: 16 U/L (ref 0–35)
AST: 18 U/L (ref 0–37)
Albumin: 4.3 g/dL (ref 3.5–5.2)
Alkaline Phosphatase: 94 U/L (ref 39–117)
Bilirubin, Direct: 0.1 mg/dL (ref 0.0–0.3)
Total Bilirubin: 0.6 mg/dL (ref 0.2–1.2)
Total Protein: 7.3 g/dL (ref 6.0–8.3)

## 2019-03-21 LAB — VITAMIN D 25 HYDROXY (VIT D DEFICIENCY, FRACTURES): VITD: 27.33 ng/mL — ABNORMAL LOW (ref 30.00–100.00)

## 2019-03-21 MED ORDER — FLUOXETINE HCL 20 MG PO CAPS: 20.0000 mg | ORAL_CAPSULE | Freq: Every day | ORAL | 3 refills | Status: DC

## 2019-03-21 NOTE — Assessment & Plan Note (Signed)
Ongoing issue for pt.  She plans to resume working out now that gyms are opening.  Applauded her efforts.  Continue to work on [employer].  Will follow.

## 2019-03-21 NOTE — Patient Instructions (Addendum)
Follow up in 1 month to recheck mood We'll notify you of your lab results and make any changes if needed STOP the Sertraline START the Fluoxetine (Prozac) daily Continue to work on healthy diet and regular exercise- you can do it! Call with any questions or concerns Stay Safe!!!

## 2019-03-21 NOTE — Progress Notes (Signed)
   Subjective:    Patient ID: Phyllis Shelton, female    DOB: 1975-10-01, 43 y.o.   MRN: [id number]  HPI CPE- UTD on pap (scheduled for next month), mammo, Tdap.  Due for flu.   Review of Systems Patient reports no vision/ hearing changes, adenopathy,fever, weight change,  persistant/recurrent hoarseness , swallowing issues, chest pain, palpitations, edema, persistant/recurrent cough, hemoptysis, dyspnea (rest/exertional/paroxysmal nocturnal), gastrointestinal bleeding (melena, rectal bleeding), abdominal pain, significant heartburn, bowel changes, GU symptoms (dysuria, hematuria, incontinence), Gyn symptoms (abnormal  bleeding, pain),  syncope, focal weakness, memory loss, numbness & tingling, skin/hair/nail changes, abnormal bruising or bleeding.  Depression- pt's PHQ=8.  On Sertraline 25mg  daily.  'the mood is not great'.  'it's almost as if I don't care that the mood isn't great'.  Loss of interest.  Feeling flat.    Objective:   Physical Exam General Appearance:    Alert, cooperative, no distress, appears stated age, obese  Head:    Normocephalic, without obvious abnormality, atraumatic  Eyes:    PERRL, conjunctiva/corneas clear, EOM's intact, fundi    benign, both eyes  Ears:    Normal TM's and external ear canals, both ears  Nose:   Deferred due to COVID  Throat:   Neck:   Supple, symmetrical, trachea midline, no adenopathy;    Thyroid: no enlargement/tenderness/nodules  Back:     Symmetric, no curvature, ROM normal, no CVA tenderness  Lungs:     Clear to auscultation bilaterally, respirations unlabored  Chest Wall:    No tenderness or deformity   Heart:    Regular rate and rhythm, S1 and S2 normal, no murmur, rub   or gallop  Breast Exam:    Deferred to GYN  Abdomen:     Soft, non-tender, bowel sounds active all four quadrants,    no masses, no organomegaly  Genitalia:    Deferred to GYN  Rectal:    Extremities:   Extremities normal, atraumatic, no cyanosis or edema  Pulses:    2+ and symmetric all extremities  Skin:   Skin color, texture, turgor normal, no rashes or lesions  Lymph nodes:   Cervical, supraclavicular, and axillary nodes normal  Neurologic:   CNII-XII intact, normal strength, sensation and reflexes    throughout          Assessment & Plan:

## 2019-03-21 NOTE — Assessment & Plan Note (Signed)
Check labs and replete prn. 

## 2019-03-21 NOTE — Assessment & Plan Note (Signed)
Unchanged from previous.  Pt is feeling flat and doesn't care.  This is not an improvement.  Will switch to Prozac and follow closely.  Pt expressed understanding and is in agreement w/ plan.

## 2019-03-21 NOTE — Assessment & Plan Note (Signed)
Pt's PE WNL w/ exception of obesity.  UTD on pap, mammo.  Flu shot given.  Check labs.  Anticipatory guidance provided.

## 2019-03-22 ENCOUNTER — Encounter: Payer: Self-pay | Admitting: General Practice

## 2019-03-24 DIAGNOSIS — F4323 Adjustment disorder with mixed anxiety and depressed mood: Secondary | ICD-10-CM | POA: Diagnosis not present

## 2019-03-27 ENCOUNTER — Ambulatory Visit: Payer: BC Managed Care – PPO

## 2019-03-27 ENCOUNTER — Other Ambulatory Visit: Payer: Self-pay

## 2019-03-27 DIAGNOSIS — G4733 Obstructive sleep apnea (adult) (pediatric): Secondary | ICD-10-CM

## 2019-03-27 DIAGNOSIS — R0683 Snoring: Secondary | ICD-10-CM | POA: Diagnosis not present

## 2019-03-29 DIAGNOSIS — R0683 Snoring: Secondary | ICD-10-CM | POA: Diagnosis not present

## 2019-04-03 DIAGNOSIS — F4323 Adjustment disorder with mixed anxiety and depressed mood: Secondary | ICD-10-CM | POA: Diagnosis not present

## 2019-04-10 NOTE — Telephone Encounter (Signed)
Received MyChart message from pt inquiring why the second page to her HST 03/27/2019 was not included in the scan. I let pt know that we would look into this for her and reach out to her tomorrow via MyChart or phone call.   Routing to [place] for f/u. Patrice, please advise. Thank you.

## 2019-04-11 NOTE — Telephone Encounter (Signed)
When reviewing the media tab for this patient all 4 pages of the home sleep study is scanned. -pr

## 2019-04-17 DIAGNOSIS — F4323 Adjustment disorder with mixed anxiety and depressed mood: Secondary | ICD-10-CM | POA: Diagnosis not present

## 2019-04-20 ENCOUNTER — Ambulatory Visit (INDEPENDENT_AMBULATORY_CARE_PROVIDER_SITE_OTHER): Payer: BC Managed Care – PPO | Admitting: Family Medicine

## 2019-04-20 ENCOUNTER — Other Ambulatory Visit: Payer: Self-pay

## 2019-04-20 ENCOUNTER — Encounter: Payer: Self-pay | Admitting: Family Medicine

## 2019-04-20 DIAGNOSIS — F329 Major depressive disorder, single episode, unspecified: Secondary | ICD-10-CM | POA: Diagnosis not present

## 2019-04-20 DIAGNOSIS — F419 Anxiety disorder, unspecified: Secondary | ICD-10-CM

## 2019-04-20 DIAGNOSIS — F32A Depression, unspecified: Secondary | ICD-10-CM

## 2019-04-20 NOTE — Progress Notes (Signed)
   Virtual Visit via Video   I connected with patient on 04/20/19 at 10:30 AM EDT by a video enabled telemedicine application and verified that I am speaking with the correct person using two identifiers.  Location patient: Home Location provider: Acupuncturist, Office Persons participating in the virtual visit: Patient, Provider, [place] (Jess B)  I discussed the limitations of evaluation and management by telemedicine and the availability of in person appointments. The patient expressed understanding and agreed to proceed.  Subjective:   HPI:   Mood- Pt was switched from Sertraline to Prozac at last visit.  Pt reports feeling great.  'so much better'.  'I have energy- i'm back to normal'.  Sleeping 'much better'.  Starting to exercise.  Able to get work done.  Able to help her daughter who is still struggling.  Pt is interested in staying at current dose.  ROS:   See pertinent positives and negatives per HPI.  Patient Active Problem List   Diagnosis Date Noted  . Snoring 02/26/2019  . Anxiety and depression 02/07/2019  . Tear of lateral meniscus of left knee 07/25/2018  . Chronic pain of left knee 07/07/2018  . Adenomyosis 07/31/2017  . Menorrhagia with regular cycle 07/31/2017  . Vitamin D deficiency 11/18/2016  . Physical exam 11/12/2015  . Obesity (BMI 30-39.9) 07/25/2013  . 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    Social History   Tobacco Use  . Smoking status: Never Smoker  . Smokeless tobacco: Never Used  Substance Use Topics  . Alcohol use: Yes    Alcohol/week: 1.0 - 3.0 standard drinks    Types: 1 - 3 Standard drinks or equivalent per week    Current Outpatient Medications:  .  FLUoxetine (PROZAC) 20 MG capsule, Take 1 capsule (20 mg total) by mouth daily., Disp: 30 capsule, Rfl: 3 .  levonorgestrel (MIRENA) 20 MCG/24HR IUD, 1 each by Intrauterine route once. Placed 09/15/17, Disp: , Rfl:   Allergies  Allergen Reactions  . Darvocet [Propoxyphene  N-Acetaminophen] Other (See Comments)    Pt states causes her to feel "loopy", hallucinations    Objective:   There were no vitals taken for this visit.  AAOx3, NAD NCAT, EOMI No obvious CN deficits Coloring WNL Pt is able to speak clearly, coherently without shortness of breath or increased work of breathing.  Thought process is linear.  Mood is appropriate.   Assessment and Plan:   Anxiety/depression- much better since switching to Prozac.  Energy is much improved, sleep is better, motivation has increased, started exercising, better focus.  Overall, pt is extremely pleased.  No med changes at this time.  Will follow.   Annye Asa, MD 04/20/2019

## 2019-04-20 NOTE — Progress Notes (Signed)
I have discussed the procedure for the virtual visit with the patient who has given consent to proceed with assessment and treatment.   Pt unable to obtain vitals.   Pt states that medication change is working great, that she no longer feels like a zombie. Denies any depression  Davis Gourd, CMA

## 2019-04-25 ENCOUNTER — Telehealth: Payer: Self-pay | Admitting: Family Medicine

## 2019-04-25 NOTE — Telephone Encounter (Signed)
LM asking pt to call back to schedule her cpe in Sept 2021.

## 2019-05-01 DIAGNOSIS — F4323 Adjustment disorder with mixed anxiety and depressed mood: Secondary | ICD-10-CM | POA: Diagnosis not present

## 2019-05-15 DIAGNOSIS — F4323 Adjustment disorder with mixed anxiety and depressed mood: Secondary | ICD-10-CM | POA: Diagnosis not present

## 2019-05-21 ENCOUNTER — Other Ambulatory Visit: Payer: Self-pay

## 2019-05-23 ENCOUNTER — Other Ambulatory Visit (HOSPITAL_COMMUNITY)
Admission: RE | Admit: 2019-05-23 | Discharge: 2019-05-23 | Disposition: A | Discharge status: Home / Self Care | Payer: BC Managed Care – PPO | Source: Ambulatory Visit | Attending: Certified Nurse Midwife | Admitting: Certified Nurse Midwife

## 2019-05-23 ENCOUNTER — Ambulatory Visit (INDEPENDENT_AMBULATORY_CARE_PROVIDER_SITE_OTHER): Payer: BC Managed Care – PPO | Admitting: Certified Nurse Midwife

## 2019-05-23 ENCOUNTER — Other Ambulatory Visit: Payer: Self-pay

## 2019-05-23 ENCOUNTER — Encounter: Payer: Self-pay | Admitting: Certified Nurse Midwife

## 2019-05-23 VITALS — BP 110/68 | HR 68 | Temp 97.1°F | Resp 16 | Ht 59.5 in | Wt 170.0 lb

## 2019-05-23 DIAGNOSIS — Z124 Encounter for screening for malignant neoplasm of cervix: Secondary | ICD-10-CM

## 2019-05-23 DIAGNOSIS — Z01419 Encounter for gynecological examination (general) (routine) without abnormal findings: Secondary | ICD-10-CM | POA: Diagnosis not present

## 2019-05-23 NOTE — Progress Notes (Signed)
43 y.o. G26P2002 Married  Caucasian Fe here for annual exam. Mirena IUD working well for DUB periods are still monthly most of the time, no heavy bleeding.Sees Dr. Birdie Riddle for aex and medication management. Started on Prozac and working well for depression/anxiety so far, no issues.  No LMP recorded. (Menstrual status: IUD).          Sexually active: Yes.    The current method of family planning is vasectomy and IUD Mirena.    Exercising: No.  exercise Smoker:  no  Review of Systems  Constitutional: Negative.   HENT: Negative.   Eyes: Negative.   Respiratory: Negative.   Cardiovascular: Negative.   Gastrointestinal: Negative.   Genitourinary: Negative.   Musculoskeletal: Negative.   Skin: Negative.   Neurological: Negative.   Endo/Heme/Allergies: Negative.   Psychiatric/Behavioral: Negative.     Health Maintenance: Pap:  04-02-16 neg HPV HR neg History of Abnormal Pap: no MMG:  12-18-2018 category a density birads 1:neg Self Breast exams: yes Colonoscopy:  None  BMD:   none TDaP:  2011 Shingles: no Pneumonia: no Hep C and HIV: not done Labs: if needed   reports that she has never smoked. She has never used smokeless tobacco. She reports current alcohol use. She reports that she does not use drugs.  Past Medical History:  Diagnosis Date  . Heart palpitations    neg cardiology work-up  . Palpitations   . 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    Past Surgical History:  Procedure Laterality Date  . CESAREAN SECTION    . INTRAUTERINE DEVICE (IUD) INSERTION     09-15-17 inserted  . WISDOM TOOTH EXTRACTION      Current Outpatient Medications  Medication Sig Dispense Refill  . FLUoxetine (PROZAC) 20 MG capsule Take 1 capsule (20 mg total) by mouth daily. 30 capsule 3  . levonorgestrel (MIRENA) 20 MCG/24HR IUD 1 each by Intrauterine route once. Placed 09/15/17     No current facility-administered medications for this visit.     Family History  Problem Relation Age  of Onset  . Osteoporosis Mother        diagnosed at age 54  . Cancer Mother        thyroid  . Hypertension Father   . Aneurysm Father   . Alcohol abuse Father   . Dementia Maternal Grandmother   . Cancer Paternal Grandfather        stomach  . Diabetes Paternal Grandmother     ROS:  Pertinent items are noted in HPI.  Otherwise, a comprehensive ROS was negative.  Exam:   BP 110/68   Pulse 68   Temp (!) 97.1 F (36.2 C) (Skin)   Resp 16   Ht 4' 11.5" (1.511 m)   Wt 170 lb (77.1 kg)   BMI 33.76 kg/m  Height: 4' 11.5" (151.1 cm) Ht Readings from Last 3 Encounters:  05/23/19 4' 11.5" (1.511 m)  03/21/19 4\' 11"  (1.499 m)  02/26/19 4\' 11"  (1.499 m)    General appearance: alert, cooperative and appears stated age Head: Normocephalic, without obvious abnormality, atraumatic Neck: no adenopathy, supple, symmetrical, trachea midline and thyroid normal to inspection and palpation Lungs: clear to auscultation bilaterally Breasts: normal appearance, no masses or tenderness, No nipple retraction or dimpling, No nipple discharge or bleeding, No axillary or supraclavicular adenopathy Heart: regular rate and rhythm Abdomen: soft, non-tender; no masses,  no organomegaly Extremities: extremities normal, atraumatic, no cyanosis or edema Skin: Skin color, texture, turgor normal. No rashes or lesions  Lymph nodes: Cervical, supraclavicular, and axillary nodes normal. No abnormal inguinal nodes palpated Neurologic: Grossly normal   Pelvic: External genitalia:  no lesions              Urethra:  normal appearing urethra with no masses, tenderness or lesions              Bartholin's and Skene's: normal                 Vagina: normal appearing vagina with normal color and discharge, no lesions              Cervix: no cervical motion tenderness, no lesions and normal appearance, IUD string noted in cervix appropriate length              Pap taken: Yes.   Bimanual Exam:  Uterus:  normal size,  contour, position, consistency, mobility, non-tender and anteflexed              Adnexa: normal adnexa and no mass, fullness, tenderness               Rectovaginal: Confirms               Anus:  normal sphincter tone, no lesions  Chaperone present: yes  A:  Well Woman with normal exam  Contraception spouse vasectomy  Mirena IUD for cycle control due for removal 2/282024  Anxiety /depression on Prozac with PCP management working well    P:   Reviewed health and wellness pertinent to exam  Warning signs with IUD reviewed and need to advise if occurs  Continue follow up with PCP as indicated  Pap smear: yes   counseled on breast self exam, mammography screening, adequate intake of calcium and vitamin D, diet and exercise  return annually or prn  An After Visit Summary was printed and given to the patient.

## 2019-05-23 NOTE — Patient Instructions (Signed)

## 2019-05-25 LAB — CYTOLOGY - PAP: Diagnosis: NEGATIVE

## 2019-06-04 ENCOUNTER — Ambulatory Visit: Payer: BC Managed Care – PPO | Admitting: Internal Medicine

## 2019-06-19 DIAGNOSIS — F4323 Adjustment disorder with mixed anxiety and depressed mood: Secondary | ICD-10-CM | POA: Diagnosis not present

## 2019-06-26 DIAGNOSIS — F4323 Adjustment disorder with mixed anxiety and depressed mood: Secondary | ICD-10-CM | POA: Diagnosis not present

## 2019-07-06 DIAGNOSIS — F4323 Adjustment disorder with mixed anxiety and depressed mood: Secondary | ICD-10-CM | POA: Diagnosis not present

## 2019-07-12 ENCOUNTER — Other Ambulatory Visit: Payer: Self-pay | Admitting: General Practice

## 2019-07-12 MED ORDER — FLUOXETINE HCL 20 MG PO CAPS: 20.0000 mg | ORAL_CAPSULE | Freq: Every day | ORAL | 3 refills | Status: DC

## 2019-09-22 ENCOUNTER — Ambulatory Visit: Payer: Self-pay | Attending: Internal Medicine

## 2019-09-22 DIAGNOSIS — Z23 Encounter for immunization: Secondary | ICD-10-CM | POA: Insufficient documentation

## 2019-09-22 NOTE — Progress Notes (Signed)
   Covid-19 Vaccination Clinic  Name:  Ryia Cusic    MRN: [id number] DOB: 28-Sep-1975  09/22/2019  Ms. Talluto was observed post Covid-19 immunization for 15 minutes without incident. She was provided with Vaccine Information Sheet and instruction to access the V-Safe system.   Ms. Gravois was instructed to call 911 with any severe reactions post vaccine: Marland Kitchen Difficulty breathing  . Swelling of face and throat  . A fast heartbeat  . A bad rash all over body  . Dizziness and weakness   Immunizations Administered    Name Date Dose VIS Date Route   Pfizer COVID-19 Vaccine 09/22/2019  1:30 PM 0.3 mL 06/29/2019 Intramuscular   Manufacturer: [place]   Lot: [id number]   [place]: [id number]

## 2019-10-05 ENCOUNTER — Encounter: Payer: Self-pay | Admitting: Certified Nurse Midwife

## 2019-10-13 ENCOUNTER — Ambulatory Visit: Payer: Self-pay | Attending: Internal Medicine

## 2019-10-13 DIAGNOSIS — Z23 Encounter for immunization: Secondary | ICD-10-CM

## 2019-10-13 NOTE — Progress Notes (Signed)
   Covid-19 Vaccination Clinic  Name:  Tauri Fandrich    MRN: [id number] DOB: October 03, 1975  10/13/2019  Ms. Piercy was observed post Covid-19 immunization for 15 minutes without incident. She was provided with Vaccine Information Sheet and instruction to access the V-Safe system.   Ms. Bartunek was instructed to call 911 with any severe reactions post vaccine: Marland Kitchen Difficulty breathing  . Swelling of face and throat  . A fast heartbeat  . A bad rash all over body  . Dizziness and weakness   Immunizations Administered    Name Date Dose VIS Date Route   Pfizer COVID-19 Vaccine 10/13/2019  8:57 AM 0.3 mL 06/29/2019 Intramuscular   Manufacturer: [place]   Lot: [id number]   [place]: [id number]

## 2019-11-07 ENCOUNTER — Other Ambulatory Visit: Payer: Self-pay | Admitting: General Practice

## 2019-11-07 MED ORDER — FLUOXETINE HCL 20 MG PO CAPS: 20.0000 mg | ORAL_CAPSULE | Freq: Every day | ORAL | 3 refills | Status: DC

## 2020-02-15 ENCOUNTER — Other Ambulatory Visit: Payer: Self-pay | Admitting: Family Medicine

## 2020-02-15 DIAGNOSIS — Z1231 Encounter for screening mammogram for malignant neoplasm of breast: Secondary | ICD-10-CM

## 2020-02-22 ENCOUNTER — Other Ambulatory Visit: Payer: Self-pay | Admitting: Family Medicine

## 2020-02-22 NOTE — Telephone Encounter (Signed)
Please advise, per chart pt is on 20mg 

## 2020-02-25 ENCOUNTER — Other Ambulatory Visit: Payer: Self-pay

## 2020-02-25 ENCOUNTER — Ambulatory Visit
Admission: RE | Admit: 2020-02-25 | Discharge: 2020-02-25 | Disposition: A | Discharge status: Home / Self Care | Payer: 59 | Source: Ambulatory Visit

## 2020-02-25 DIAGNOSIS — Z1231 Encounter for screening mammogram for malignant neoplasm of breast: Secondary | ICD-10-CM

## 2020-03-13 ENCOUNTER — Other Ambulatory Visit: Payer: Self-pay | Admitting: Family Medicine

## 2020-05-23 ENCOUNTER — Ambulatory Visit: Payer: BC Managed Care – PPO | Admitting: Certified Nurse Midwife

## 2020-05-27 ENCOUNTER — Ambulatory Visit (INDEPENDENT_AMBULATORY_CARE_PROVIDER_SITE_OTHER): Payer: 59 | Admitting: Family Medicine

## 2020-05-27 ENCOUNTER — Encounter: Payer: Self-pay | Admitting: Family Medicine

## 2020-05-27 ENCOUNTER — Other Ambulatory Visit: Payer: Self-pay

## 2020-05-27 VITALS — BP 116/72 | HR 58 | Temp 97.3°F | Resp 17 | Ht 59.0 in | Wt 173.0 lb

## 2020-05-27 DIAGNOSIS — Z1159 Encounter for screening for other viral diseases: Secondary | ICD-10-CM

## 2020-05-27 DIAGNOSIS — Z1211 Encounter for screening for malignant neoplasm of colon: Secondary | ICD-10-CM | POA: Diagnosis not present

## 2020-05-27 DIAGNOSIS — E559 Vitamin D deficiency, unspecified: Secondary | ICD-10-CM

## 2020-05-27 DIAGNOSIS — E669 Obesity, unspecified: Secondary | ICD-10-CM

## 2020-05-27 DIAGNOSIS — Z23 Encounter for immunization: Secondary | ICD-10-CM | POA: Diagnosis not present

## 2020-05-27 DIAGNOSIS — Z114 Encounter for screening for human immunodeficiency virus [HIV]: Secondary | ICD-10-CM

## 2020-05-27 DIAGNOSIS — Z0000 Encounter for general adult medical examination without abnormal findings: Secondary | ICD-10-CM | POA: Diagnosis not present

## 2020-05-27 LAB — CBC WITH DIFFERENTIAL/PLATELET
Basophils Absolute: 0.1 10*3/uL (ref 0.0–0.1)
Basophils Relative: 1.1 % (ref 0.0–3.0)
Eosinophils Absolute: 0.2 10*3/uL (ref 0.0–0.7)
Eosinophils Relative: 2.6 % (ref 0.0–5.0)
HCT: 42.2 % (ref 36.0–46.0)
Hemoglobin: 14.2 g/dL (ref 12.0–15.0)
Lymphocytes Relative: 27 % (ref 12.0–46.0)
Lymphs Abs: 2.2 10*3/uL (ref 0.7–4.0)
MCHC: 33.6 g/dL (ref 30.0–36.0)
MCV: 89.9 fl (ref 78.0–100.0)
Monocytes Absolute: 0.7 10*3/uL (ref 0.1–1.0)
Monocytes Relative: 8.5 % (ref 3.0–12.0)
Neutro Abs: 4.9 10*3/uL (ref 1.4–7.7)
Neutrophils Relative %: 60.8 % (ref 43.0–77.0)
Platelets: 244 10*3/uL (ref 150.0–400.0)
RBC: 4.7 Mil/uL (ref 3.87–5.11)
RDW: 13.4 % (ref 11.5–15.5)
WBC: 8 10*3/uL (ref 4.0–10.5)

## 2020-05-27 LAB — BASIC METABOLIC PANEL
BUN: 14 mg/dL (ref 6–23)
CO2: 28 mEq/L (ref 19–32)
Calcium: 9.5 mg/dL (ref 8.4–10.5)
Chloride: 104 mEq/L (ref 96–112)
Creatinine, Ser: 0.85 mg/dL (ref 0.40–1.20)
GFR: 82.01 mL/min (ref >60.00)
Glucose, Bld: 87 mg/dL (ref 70–99)
Potassium: 4.3 mEq/L (ref 3.5–5.1)
Sodium: 139 mEq/L (ref 135–145)

## 2020-05-27 LAB — LIPID PANEL
Cholesterol: 178 mg/dL (ref 0–200)
HDL: 48.7 mg/dL (ref >39.00)
LDL Cholesterol: 106 mg/dL — ABNORMAL HIGH (ref 0–99)
NonHDL: 129.75
Total CHOL/HDL Ratio: 4
Triglycerides: 119 mg/dL (ref 0.0–149.0)
VLDL: 23.8 mg/dL (ref 0.0–40.0)

## 2020-05-27 LAB — HEPATIC FUNCTION PANEL
ALT: 20 U/L (ref 0–35)
AST: 19 U/L (ref 0–37)
Albumin: 4.3 g/dL (ref 3.5–5.2)
Alkaline Phosphatase: 106 U/L (ref 39–117)
Bilirubin, Direct: 0.1 mg/dL (ref 0.0–0.3)
Total Bilirubin: 0.7 mg/dL (ref 0.2–1.2)
Total Protein: 7.1 g/dL (ref 6.0–8.3)

## 2020-05-27 LAB — VITAMIN D 25 HYDROXY (VIT D DEFICIENCY, FRACTURES): VITD: 19.15 ng/mL — ABNORMAL LOW (ref 30.00–100.00)

## 2020-05-27 LAB — TSH: TSH: 1.53 u[IU]/mL (ref 0.35–4.50)

## 2020-05-27 NOTE — Patient Instructions (Signed)
Follow up in 1 year or as needed We'll notify you of your lab results and make any changes if needed Continue to work on healthy diet and regular exercise- you can do it! We'll call you with your GI appt to discuss colonoscopy Call with any questions or concerns Stay Safe!  Stay Healthy! Happy Holidays!

## 2020-05-27 NOTE — Assessment & Plan Note (Signed)
Pt has hx of this.  Check labs and replete prn. 

## 2020-05-27 NOTE — Assessment & Plan Note (Signed)
Pt's PE WNL w/ exception of obesity.  UTD on pap, mammo.  Will refer for colonoscopy as she is 8.  UTD on COVID and flu.  Tdap given today.  Check labs.  Anticipatory guidance provided.

## 2020-05-27 NOTE — Addendum Note (Signed)
Addended by: Fritz Pickerel on: 05/27/2020 10:50 AM   Modules accepted: Orders

## 2020-05-27 NOTE — Assessment & Plan Note (Signed)
Ongoing issue for pt.  BMI is 34.94.  Encouraged healthy diet and regular exercise.  Will check labs to risk stratify.

## 2020-05-27 NOTE — Progress Notes (Signed)
   Subjective:    Patient ID: Phyllis Shelton, female    DOB: 11/02/1975, 44 y.o.   MRN: [id number]  HPI CPE- UTD on pap, mammo, COVID, flu shot.  Due for Tdap.    Reviewed past medical, surgical, family and social histories.   Patient Care Team    Relationship Specialty Notifications Start End  Midge Minium, MD PCP - General   07/01/10   Regina Eck, CNM Referring Physician Certified Nurse Midwife  11/12/15     Health Maintenance  Topic Date Due  . Hepatitis C Screening  Never done  . HIV Screening  Never done  . TETANUS/TDAP  07/17/2020 (Originally 08/12/2019)  . MAMMOGRAM  02/24/2021  . PAP SMEAR-Modifier  05/22/2022  . INFLUENZA VACCINE  Completed  . COVID-19 Vaccine  Completed      Review of Systems Patient reports no vision/ hearing changes, adenopathy,fever, weight change,  persistant/recurrent hoarseness , swallowing issues, chest pain, palpitations, edema, persistant/recurrent cough, hemoptysis, dyspnea (rest/exertional/paroxysmal nocturnal), gastrointestinal bleeding (melena, rectal bleeding), abdominal pain, significant heartburn, bowel changes, GU symptoms (dysuria, hematuria, incontinence), Gyn symptoms (abnormal  bleeding, pain),  syncope, focal weakness, memory loss, numbness & tingling, skin/hair/nail changes, abnormal bruising or bleeding, anxiety, or depression.   This visit occurred during the SARS-CoV-2 public health emergency.  Safety protocols were in place, including screening questions prior to the visit, additional usage of staff PPE, and extensive cleaning of exam room while observing appropriate contact time as indicated for disinfecting solutions.       Objective:   Physical Exam General Appearance:    Alert, cooperative, no distress, appears stated age, obese  Head:    Normocephalic, without obvious abnormality, atraumatic  Eyes:    PERRL, conjunctiva/corneas clear, EOM's intact, fundi    benign, both eyes  Ears:    Normal TM's and external  ear canals, both ears  Nose:   Deferred due to COVID  Throat:   Neck:   Supple, symmetrical, trachea midline, no adenopathy;    Thyroid: no enlargement/tenderness/nodules  Back:     Symmetric, no curvature, ROM normal, no CVA tenderness  Lungs:     Clear to auscultation bilaterally, respirations unlabored  Chest Wall:    No tenderness or deformity   Heart:    Regular rate and rhythm, S1 and S2 normal, no murmur, rub   or gallop  Breast Exam:    Deferred to GYN  Abdomen:     Soft, non-tender, bowel sounds active all four quadrants,    no masses, no organomegaly  Genitalia:    Deferred to GYN  Rectal:    Extremities:   Extremities normal, atraumatic, no cyanosis or edema  Pulses:   2+ and symmetric all extremities  Skin:   Skin color, texture, turgor normal, no rashes or lesions  Lymph nodes:   Cervical, supraclavicular, and axillary nodes normal  Neurologic:   CNII-XII intact, normal strength, sensation and reflexes    throughout          Assessment & Plan:

## 2020-05-28 ENCOUNTER — Other Ambulatory Visit: Payer: Self-pay

## 2020-05-28 DIAGNOSIS — E559 Vitamin D deficiency, unspecified: Secondary | ICD-10-CM

## 2020-05-28 LAB — HEPATITIS C ANTIBODY
Hepatitis C Ab: NONREACTIVE
SIGNAL TO CUT-OFF: 0 (ref <1.00)

## 2020-05-28 LAB — HIV ANTIBODY (ROUTINE TESTING W REFLEX): HIV 1&2 Ab, 4th Generation: NONREACTIVE

## 2020-05-28 MED ORDER — VITAMIN D (ERGOCALCIFEROL) 1.25 MG (50000 UNIT) PO CAPS: 50000.0000 [IU] | ORAL_CAPSULE | ORAL | 0 refills | Status: DC

## 2020-07-06 ENCOUNTER — Other Ambulatory Visit: Payer: Self-pay | Admitting: Family Medicine

## 2020-08-19 NOTE — Progress Notes (Signed)
02/26/2019- 45 yo married F never smoker for sleep evaluation: referred by Dr. Birdie Riddle (PCP) for snoring, pt denies historical sleep study, denies CPAP use Body weight today 170 lbs Epworth score 11 Medical problem list includes Obesity, anxiety and depresion For at least 6 months has  Been told of loud snoring, frequent waking, witnessed apneas. Tired during the day but not enough to afect driving. Can doze off during tv. Melatonin at bedtime, 1-2 cups coffee in AM. Denies ENT surgery, known neuro, heart of lung problems.  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  Falls asleep quickly but easily awakened by any disturbance.  We reviewed her sleep study. Do not get hx cataplexy or sleep paralysis. We Discussed trial of a stimulant med, sleep hygiene, naps, safe driving responsibility.  ROS-see HPI   + = positive Constitutional:    weight loss, night sweats, fevers, chills, fatigue, lassitude. HEENT:    headaches, difficulty swallowing, tooth/dental problems, sore throat,       sneezing, itching, ear ache, nasal congestion, post nasal drip, snoring CV:    chest pain, orthopnea, PND, swelling in lower extremities, anasarca,                                  dizziness, palpitations Resp:   shortness of breath with exertion or at rest.                productive cough,   non-productive cough, coughing up of blood.              change in color of mucus.  wheezing.   Skin:    rash or lesions. GI:  + heartburn, indigestion, abdominal pain, nausea, vomiting, diarrhea,                 change in bowel habits, loss of appetite GU: dysuria, change in color of urine, no  urgency or frequency.   flank pain. MS:   joint pain, stiffness, decreased range of motion, back pain. Neuro-     nothing unusual Psych:  change in mood or affect.  +depression or +anxiety.   memory loss.  OBJ- Physical Exam General- Alert, Oriented, Affect-appropriate, Distress- none acute, + obese Skin- rash-none, lesions- none, excoriation- none Lymphadenopathy- none Head- atraumatic            Eyes- Gross vision intact, PERRLA, conjunctivae and secretions clear            Ears- Hearing, canals-normal            Nose- Clear, no-Septal dev, mucus, polyps, erosion, perforation             Throat- Mallampati IV , mucosa clear , drainage- none, tonsils- atrophic Neck- flexible , trachea midline, no stridor , thyroid nl, carotid no bruit Chest - symmetrical excursion , unlabored           Heart/CV- RRR , no murmur , no gallop  , no rub, nl s1 s2                           -  JVD- none , edema- none, stasis changes- none, varices- none           Lung- clear to P&A, wheeze- none, cough- none , dullness-none, rub- none           Chest wall-  Abd-  Br/ Gen/ Rectal- Not done, not indicated Extrem- cyanosis- none, clubbing, none, atrophy- none, strength- nl Neuro- grossly intact to observation

## 2020-08-20 ENCOUNTER — Encounter: Payer: Self-pay | Admitting: Internal Medicine

## 2020-08-20 ENCOUNTER — Other Ambulatory Visit: Payer: Self-pay

## 2020-08-20 ENCOUNTER — Ambulatory Visit (INDEPENDENT_AMBULATORY_CARE_PROVIDER_SITE_OTHER): Payer: 59 | Admitting: Internal Medicine

## 2020-08-20 DIAGNOSIS — E669 Obesity, unspecified: Secondary | ICD-10-CM | POA: Diagnosis not present

## 2020-08-20 DIAGNOSIS — G4710 Hypersomnia, unspecified: Secondary | ICD-10-CM | POA: Diagnosis not present

## 2020-08-20 MED ORDER — AMPHETAMINE-DEXTROAMPHET ER 15 MG PO CP24: ORAL_CAPSULE | ORAL | 0 refills | Status: DC

## 2020-08-20 NOTE — Patient Instructions (Signed)
We will send script to [facility] for Adderall 15 mg XR, 1 each morning as needed for alertness  Please call as needed

## 2020-08-22 IMAGING — MR MR KNEE*L* W/O CM
4 of 7 series · 23 of 40 positions shown · non-contrast
Comparison: None.

CLINICAL DATA: The patient suffered a left knee injury working [DATE] month ago. Continued pain. Initial encounter.

EXAM:
MRI OF THE LEFT KNEE WITHOUT CONTRAST
TECHNIQUE: Multiplanar, multisequence MR imaging of the knee was performed. No
intravenous contrast was administered.

[Series 4: T2 fat-sat · axial · 4.0mm · 0.50mm/px · z∈[-43,+72]mm · 6 of 24 slices shown]
[im 1/24]
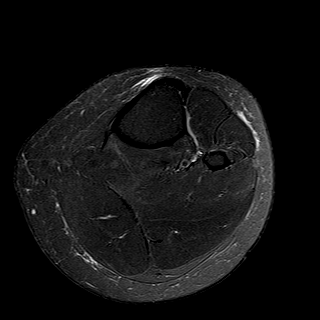
[im 5/24]
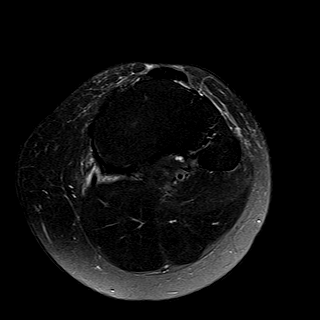
[im 10/24]
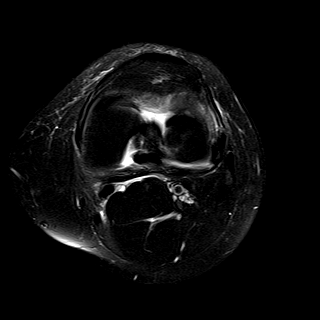
[im 14/24]
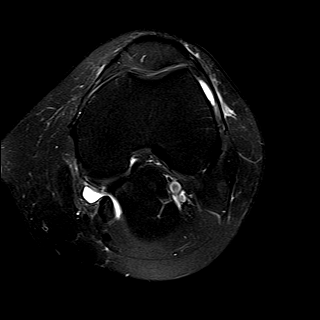
[im 19/24]
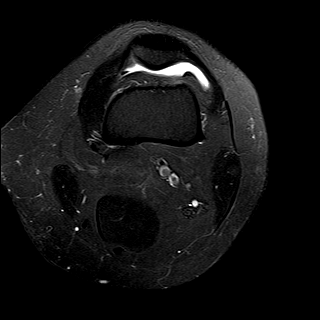
[im 24/24]
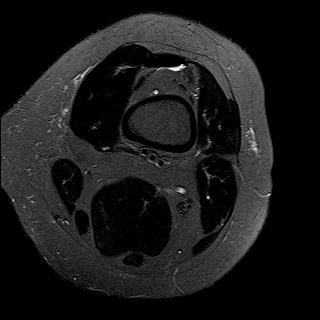

[Series 7: PD fat-sat · coronal · 3.0mm · 0.29mm/px · 7 of 28 slices shown (1 of 3)]
[im 1/28]
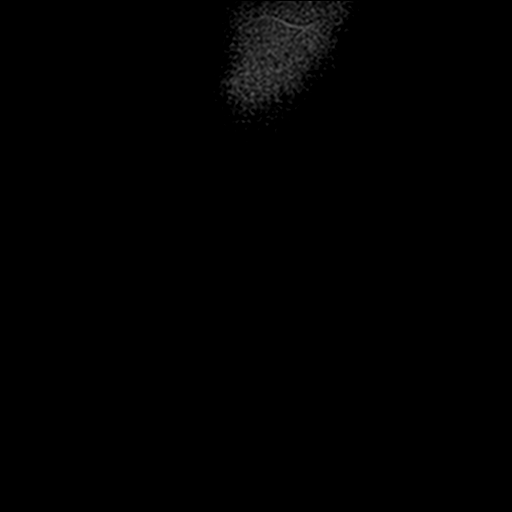
[im 5/28]
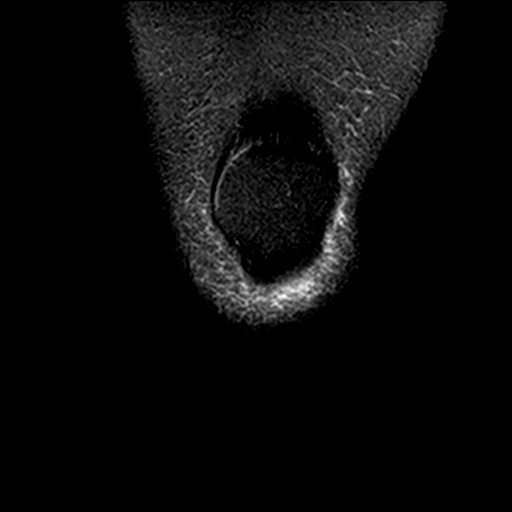
[im 10/28]
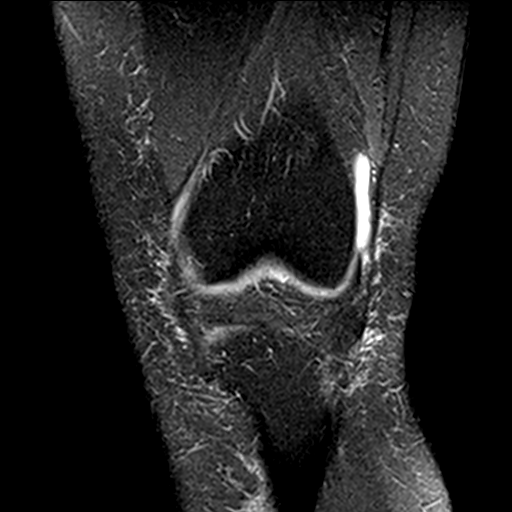
[im 14/28]
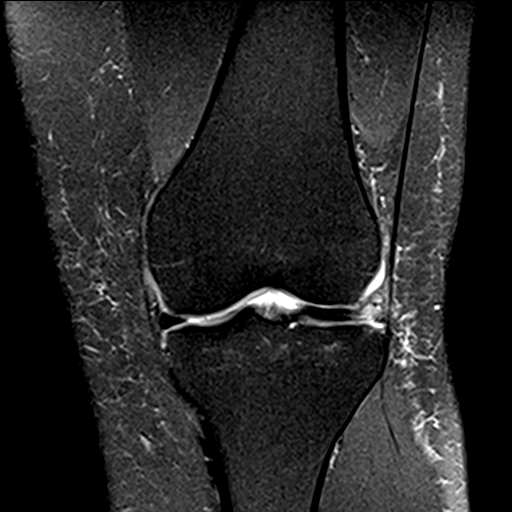
[im 19/28]
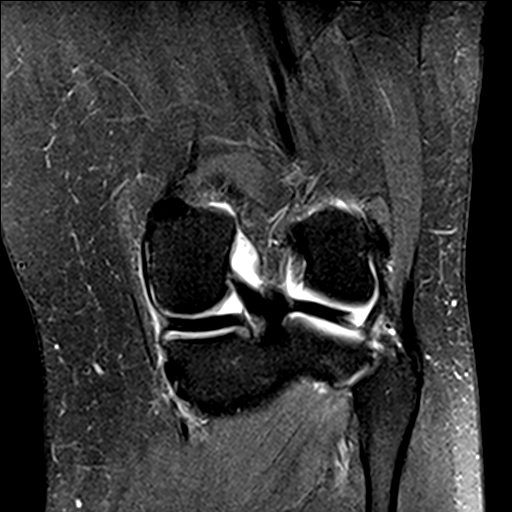
[im 23/28]
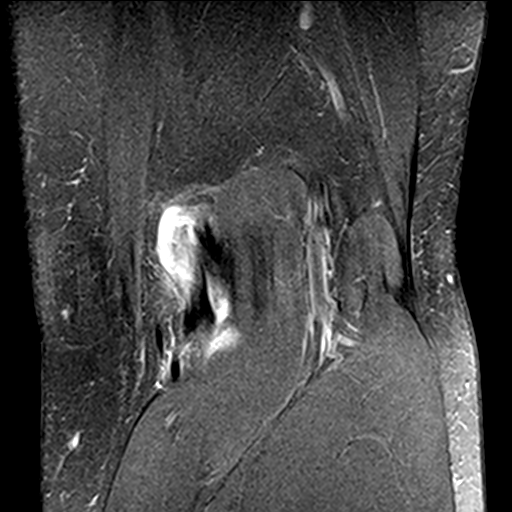
[im 28/28]
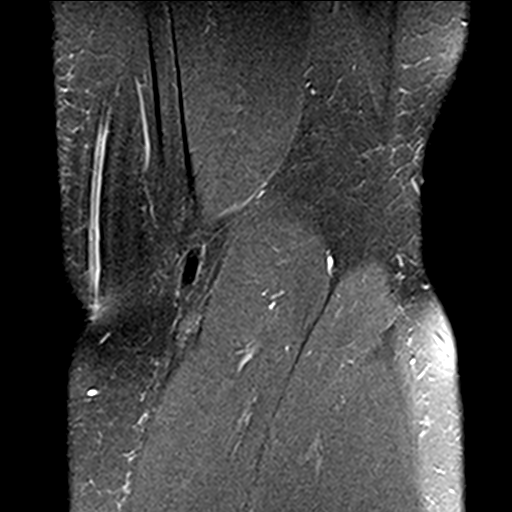

[Series 9: PD fat-sat · sagittal · 3.0mm · 0.29mm/px · 7 of 27 slices shown (2 of 3)]
[im 1/27]
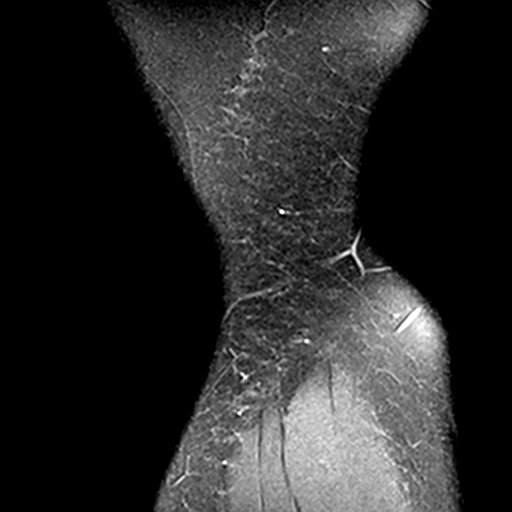
[im 5/27]
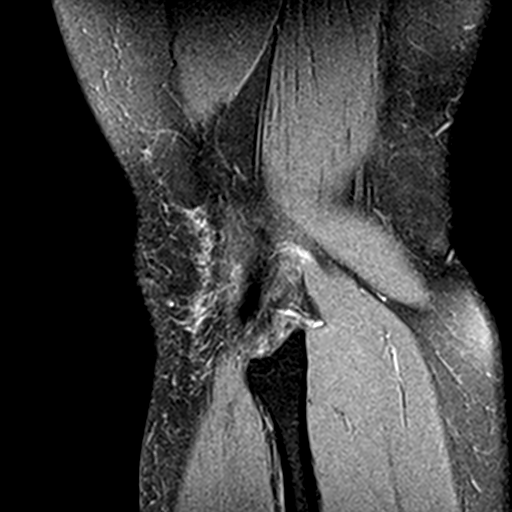
[im 9/27]
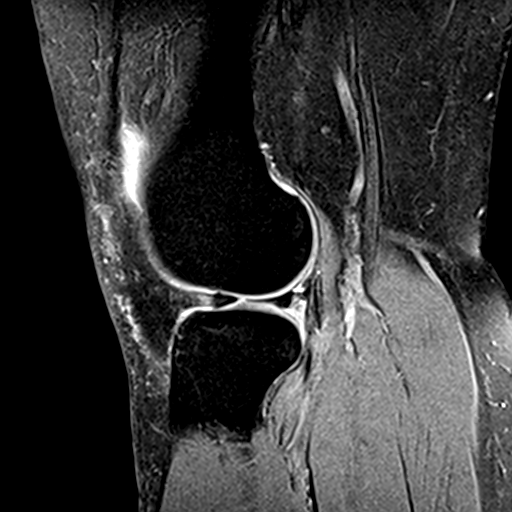
[im 14/27]
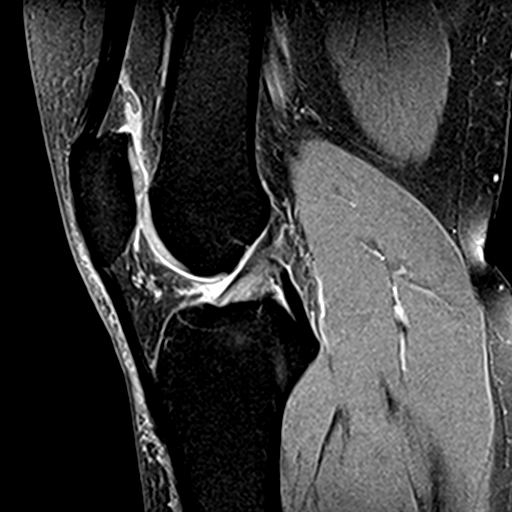
[im 18/27]
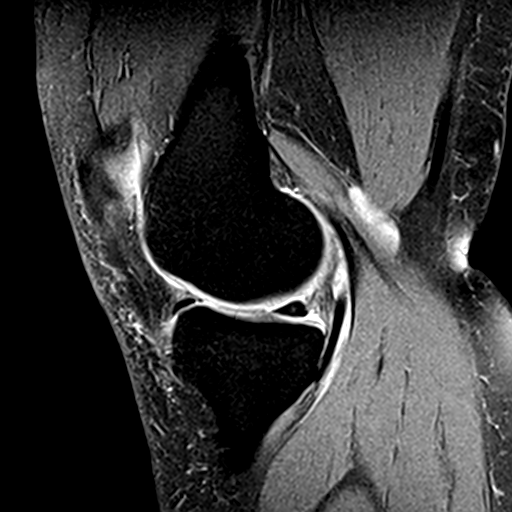
[im 22/27]
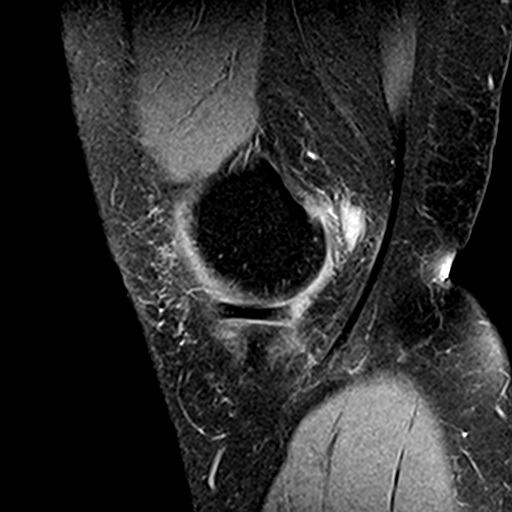
[im 27/27]
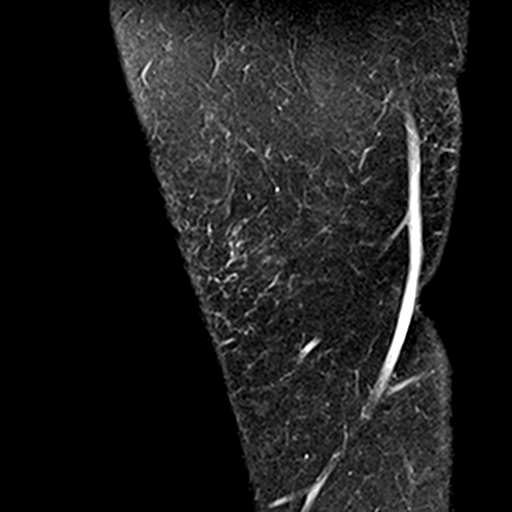

[Series 10: PD fat-sat · oblique · 2.0mm · 0.29mm/px · 3 of 11 slices shown (3 of 3)]
[im 1/11]
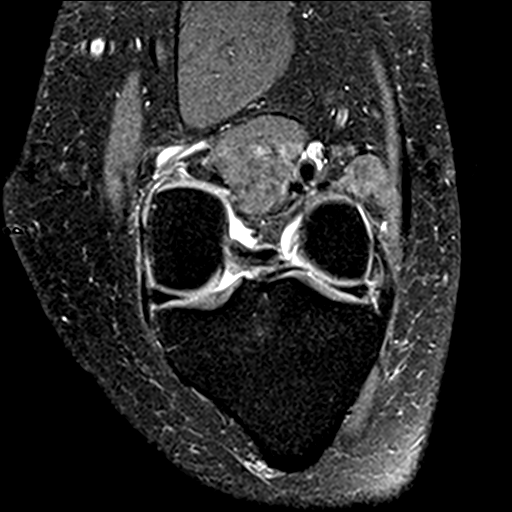
[im 6/11]
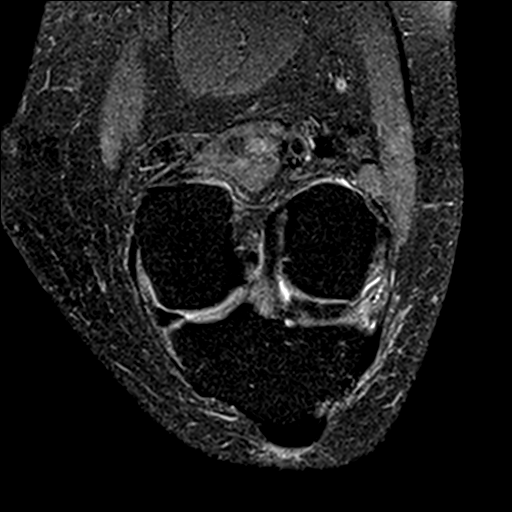
[im 11/11]
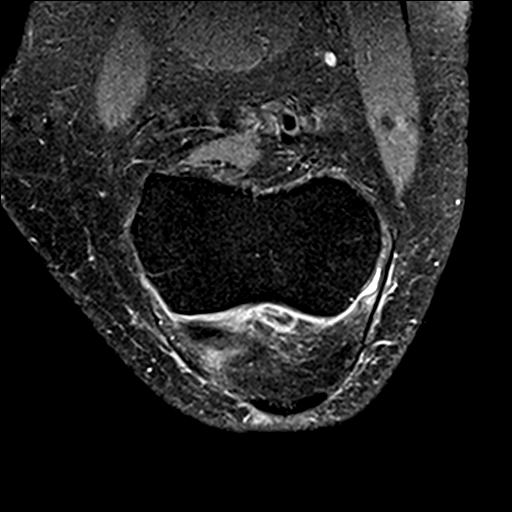

[23 of 40 positions shown; findings below may reference images not displayed]

FINDINGS: MENISCI

Medial meniscus:  Intact.

Lateral meniscus: There is a radial tear along the free edge at the
junction of the anterior horn and body. Capsular surface tearing is
also seen deep to the iliotibial band.

LIGAMENTS

Cruciates:  Intact.

Collaterals:  Intact.

CARTILAGE

Patellofemoral:  Normal.

Medial:  Normal.

Lateral:  Normal.

Joint:  Small joint effusion.

Popliteal Fossa:  Small Baker's cyst.

Extensor Mechanism:  Intact.

Bones:  Normal marrow signal.  No fracture or worrisome lesion.

Other: None.
IMPRESSION: Radial tear along the free edge of the lateral meniscus at the
junction of the anterior horn and body. There is also tearing of the
capsular surface of the lateral meniscus deep to the iliotibial
band. The exam is otherwise

## 2020-08-25 NOTE — Assessment & Plan Note (Signed)
Discussed benefit of normal body weight toward improving sleep and snoring.

## 2020-08-25 NOTE — Assessment & Plan Note (Signed)
Can't define an organic disorder. We discussed and will try adderall as a stimulant to see how that feels. Med talk done. Plan- Adderall XR 15 mg, once daily as needed

## 2020-09-11 ENCOUNTER — Encounter: Payer: Self-pay | Admitting: Family Medicine

## 2020-09-11 ENCOUNTER — Telehealth: Payer: Self-pay | Admitting: Family Medicine

## 2020-09-11 NOTE — Telephone Encounter (Signed)
Patient needs a tb test for her new job - she also has a form to fill out - can it be filled out from her physical she had in November?  Please advise

## 2020-09-11 NOTE — Telephone Encounter (Signed)
Ok to schedule a TB test or does patient need to be seen first?

## 2020-09-11 NOTE — Telephone Encounter (Signed)
She can have a nurse visit for Quantiferon Gold Plus (dx TB screen) and we can complete her form based on her recent physical

## 2020-09-11 NOTE — Telephone Encounter (Signed)
Called and left a vm concerning TB test. I informed patient that she can call back and schedule the test and we can use her last physical to fill out forms.

## 2020-09-23 ENCOUNTER — Other Ambulatory Visit: Payer: Self-pay

## 2020-09-23 ENCOUNTER — Telehealth: Payer: Self-pay | Admitting: Family Medicine

## 2020-09-23 DIAGNOSIS — Z111 Encounter for screening for respiratory tuberculosis: Secondary | ICD-10-CM

## 2020-09-23 NOTE — Telephone Encounter (Signed)
Called and scheduled patient for TB Quantiferon test.

## 2020-09-23 NOTE — Telephone Encounter (Signed)
Ok for pt to schedule TB test- lab visit for [facility]

## 2020-09-23 NOTE — Telephone Encounter (Signed)
Patient needs to be scheduled for a tb test for her job.  Please advise

## 2020-09-23 NOTE — Telephone Encounter (Signed)
Ok to schedule for Tb Test?

## 2020-09-26 ENCOUNTER — Other Ambulatory Visit: Payer: Self-pay

## 2020-09-26 ENCOUNTER — Other Ambulatory Visit: Payer: BC Managed Care – PPO

## 2020-09-26 DIAGNOSIS — Z111 Encounter for screening for respiratory tuberculosis: Secondary | ICD-10-CM

## 2020-09-29 LAB — QUANTIFERON-TB GOLD PLUS
Mitogen-NIL: >10 IU/mL
NIL: 0.02 IU/mL
QuantiFERON-TB Gold Plus: NEGATIVE
TB1-NIL: 0 IU/mL
TB2-NIL: 0.01 IU/mL

## 2020-10-16 MED ORDER — AMPHETAMINE-DEXTROAMPHET ER 15 MG PO CP24: ORAL_CAPSULE | ORAL | 0 refills | Status: DC

## 2020-10-16 NOTE — Telephone Encounter (Signed)
Adderall refilled

## 2020-10-16 NOTE — Telephone Encounter (Signed)
Received a MyChart message from patient asking for a refill on her Adderall XR 15mg  capsules. Last refill was sent in on 08/20/20 for 31 capsules. Last OV was on 08/20/20. Her next OV is scheduled for 11/17/20 with Dr. Annamaria Boots.  Pharmacy is [facility] on [address].   Dr. Annamaria Boots, please advise if you are ok with this refill. Thanks!

## 2020-10-21 NOTE — Telephone Encounter (Signed)
mychart message received by pt stating that her Adderall XR 15mg  Rx was requiring a PA. Tried attempting the PA and it asked if any of the formulary alternatives had been attempted and I do not see where pt has tried any other meds.  Formulary alternatives include: -amphetamine-dextroamphetamine mixed salts extended release -methylphenidate extended release -Vyvanse -Mydayis  Dr. Annamaria Boots, please advise.

## 2020-10-21 NOTE — Telephone Encounter (Signed)
Message received from CY: Baird Lyons D, MD  You 50 minutes ago (4:21 PM)    The generic for Adderall XR is their listed alternative amphetamine- dextroamphetamine mixed salts. We can say yes and we can request that instead of brand name.    Will do PA and request generic instead of brand name.

## 2020-10-22 ENCOUNTER — Telehealth: Payer: Self-pay | Admitting: Internal Medicine

## 2020-10-22 NOTE — Telephone Encounter (Signed)
Checked status on [email or web address]. PA was approved 10/22/2020-10/23/2023. Pharmacy is aware of approval. Will send MyChart message to patient to let her know.

## 2020-10-22 NOTE — Telephone Encounter (Signed)
PA request was received from (pharmacy): [facility] on [address] Phone: [phone]  Medication name and strength: Adderall XR 15mg  Ordering Provider: CY  Was PA started with CMM?: Yes If yes, please enter KEY: [id number] Medication tried and failed:  Covered Alternatives:   PA sent to plan, time frame for approval / denial: Up to 72hrs

## 2020-11-14 ENCOUNTER — Other Ambulatory Visit: Payer: Self-pay | Admitting: Family Medicine

## 2020-11-14 NOTE — Progress Notes (Signed)
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  Falls asleep quickly but easily awakened by any disturbance.  We reviewed her sleep study. Do not get hx cataplexy or sleep paralysis. We Discussed trial of a stimulant med, sleep hygiene, naps, safe driving responsibility.  11/17/20- 54 yoF married never smoker with followed for Somnolence, complicated by Obesity, anxiety and depression, Vitamin D deficiency  Body weight today-168 lbs Covid vax- -----Reports no longer taking aderall.  Requesting another sleep study for daytime fatigue.  Adderall may have helped some, but she "crashed" and felt really tired for a couple of days after stopping. Still distressed by daytime fatigue. Wakes after sleep onset. Again denies cataplexy, sleep paralysis or vivid dreams. After discussion, rather than repeating home sleep test, we are going to get formal NPSG followed by MSLT. She feels she can be off Prozax for this.  ROS-see HPI   + = positive Constitutional:    weight loss, night sweats, fevers, chills, +fatigue, lassitude. HEENT:    headaches, difficulty swallowing, tooth/dental problems, sore throat,       sneezing, itching, ear ache, nasal congestion, post nasal drip, snoring CV:    chest pain, orthopnea, PND, swelling in lower extremities, anasarca,                                  dizziness, palpitations Resp:   shortness of breath  with exertion or at rest.                productive cough,   non-productive cough, coughing up of blood.              change in color of mucus.  wheezing.   Skin:    rash or lesions. GI:  + heartburn, indigestion, abdominal pain, nausea, vomiting, diarrhea,                 change in bowel habits, loss of appetite GU: dysuria, change in color of urine, no urgency or frequency.   flank pain. MS:   joint pain, stiffness, decreased range of motion, back pain. Neuro-     nothing unusual Psych:  change in mood or affect.  +depression or +anxiety.   memory loss.  OBJ- Physical Exam General- Alert, Oriented, Affect-appropriate, Distress- none acute, + obese Skin- rash-none, lesions- none, excoriation- none Lymphadenopathy- none Head- atraumatic            Eyes- Gross vision intact, PERRLA, conjunctivae and secretions clear            Ears- Hearing, canals-normal            Nose- Clear, no-Septal dev, mucus, polyps, erosion, perforation             Throat- Mallampati IV , mucosa clear , drainage- none, tonsils- atrophic Neck- flexible , trachea midline, no stridor , thyroid nl, carotid no bruit Chest - symmetrical excursion , unlabored  Heart/CV- RRR , no murmur , no gallop  , no rub, nl s1 s2                           - JVD- none , edema- none, stasis changes- none, varices- none           Lung- clear to P&A, wheeze- none, cough- none , dullness-none, rub- none           Chest wall-  Abd-  Br/ Gen/ Rectal- Not done, not indicated Extrem- cyanosis- none, clubbing, none, atrophy- none, strength- nl Neuro- grossly intact to observation

## 2020-11-17 ENCOUNTER — Other Ambulatory Visit: Payer: Self-pay

## 2020-11-17 ENCOUNTER — Encounter: Payer: Self-pay | Admitting: Internal Medicine

## 2020-11-17 ENCOUNTER — Ambulatory Visit: Payer: BC Managed Care – PPO | Admitting: Internal Medicine

## 2020-11-17 VITALS — BP 112/76 | HR 70 | Temp 97.8°F | Ht 59.0 in | Wt 168.0 lb

## 2020-11-17 DIAGNOSIS — G4710 Hypersomnia, unspecified: Secondary | ICD-10-CM

## 2020-11-17 NOTE — Assessment & Plan Note (Signed)
Adderall not clearly beneficial. We discussed evaluation for primary hypersomnia before we consider more medication therapy. Plan- NPSG followed by MSLT. Call us for results. Consider Modafinil

## 2020-11-17 NOTE — Patient Instructions (Addendum)
Order- schedule NPSG sleep study, followed the next day by a Multiple Sleep Latency Test (MSLT) at [facility]   Dx hypersomnia  Sleep Diary- bring this with you to your sleep test  Stay off Adderall and Prozac for 2 weeks prior to the test. Let us know if this becomes a problem.   Please call us for results and recommendations about 2 weeks after these are done.

## 2021-01-06 ENCOUNTER — Ambulatory Visit (HOSPITAL_BASED_OUTPATIENT_CLINIC_OR_DEPARTMENT_OTHER): Payer: BC Managed Care – PPO | Attending: Internal Medicine | Admitting: Internal Medicine

## 2021-01-06 ENCOUNTER — Other Ambulatory Visit: Payer: Self-pay

## 2021-01-06 VITALS — Ht 59.0 in | Wt 170.0 lb

## 2021-01-06 DIAGNOSIS — G4710 Hypersomnia, unspecified: Secondary | ICD-10-CM

## 2021-01-06 DIAGNOSIS — R0683 Snoring: Secondary | ICD-10-CM | POA: Diagnosis not present

## 2021-01-07 ENCOUNTER — Ambulatory Visit (HOSPITAL_BASED_OUTPATIENT_CLINIC_OR_DEPARTMENT_OTHER): Payer: BC Managed Care – PPO | Attending: Internal Medicine | Admitting: Internal Medicine

## 2021-01-07 DIAGNOSIS — G47419 Narcolepsy without cataplexy: Secondary | ICD-10-CM | POA: Insufficient documentation

## 2021-01-07 DIAGNOSIS — G4711 Idiopathic hypersomnia with long sleep time: Secondary | ICD-10-CM | POA: Diagnosis not present

## 2021-01-07 DIAGNOSIS — G4710 Hypersomnia, unspecified: Secondary | ICD-10-CM

## 2021-01-10 DIAGNOSIS — G4710 Hypersomnia, unspecified: Secondary | ICD-10-CM

## 2021-01-10 NOTE — Procedures (Signed)
    Patient Name: Phyllis Shelton, [name] Date: 01/06/2021 Gender: Female D.O.B: 1976-01-10 Age (years): 78 Referring Provider: Baird Lyons MD, ABSM Height (inches): 59 Interpreting Physician: Baird Lyons MD, ABSM Weight (lbs): 170 RPSGT: Zadie Rhine BMI: 34 MRN: [id number] Neck Size: 15.00  CLINICAL INFORMATION Sleep Study Type: NPSG Indication for sleep study: Hypersomnia Epworth Sleepiness Score: 16  SLEEP STUDY TECHNIQUE As per the AASM Manual for the Scoring of Sleep and Associated Events v2.3 (April 2016) with a hypopnea requiring 4% desaturations.  The channels recorded and monitored were frontal, central and occipital EEG, electrooculogram (EOG), submentalis EMG (chin), nasal and oral airflow, thoracic and abdominal wall motion, anterior tibialis EMG, snore microphone, electrocardiogram, and pulse oximetry.  MEDICATIONS Medications self-administered by patient taken the night of the study : none reported  SLEEP ARCHITECTURE The study was initiated at 10:40:56 PM and ended at 6:00:29 AM.  Sleep onset time was 21.9 minutes and the sleep efficiency was 85.1%%. The total sleep time was 374 minutes.  Stage REM latency was 109.0 minutes.  The patient spent 2.9%% of the night in stage N1 sleep, 65.1%% in stage N2 sleep, 0.3%% in stage N3 and 31.7% in REM.  Alpha intrusion was absent.  Supine sleep was 95.72%.  RESPIRATORY PARAMETERS The overall apnea/hypopnea index (AHI) was 0.6 per hour. There were 1 total apneas, including 0 obstructive, 1 central and 0 mixed apneas. There were 3 hypopneas and 1 RERAs.  The AHI during Stage REM sleep was 2.0 per hour.  AHI while supine was 0.7 per hour.  The mean oxygen saturation was 93.1%. The minimum SpO2 during sleep was 87.0%.  moderate snoring was noted during this study.  CARDIAC DATA The 2 lead EKG demonstrated sinus rhythm. The mean heart rate was 61.4 beats per minute. Other EKG findings include: PVCs.  LEG  MOVEMENT DATA The total PLMS were 0 with a resulting PLMS index of 0.0. Associated arousal with leg movement index was 0.0 .  IMPRESSIONS - No significant obstructive sleep apnea occurred during this study (AHI = 0.6/h). - No significant central sleep apnea occurred during this study (CAI = 0.2/h). - Mild oxygen desaturation was noted during this study (Min O2 = 87.0%).  Mean O2 sat 93.1%. - Increased time in REM. - The patient snored with moderate snoring volume. - EKG findings include PVCs. - Clinically significant periodic limb movements did not occur during sleep. No significant associated arousals. - DIAGNOSIS - Primary Snoring  RECOMMENDATIONS - See result of MSLT following this study. - Sleep hygiene should be reviewed to assess factors that may improve sleep quality. - Weight management and regular exercise should be initiated or continued if appropriate.  [Electronically signed] 01/10/2021 03:18 PM  Baird Lyons MD, [place], American Board of Sleep Medicine   NPI: [id number]                         [place], [place] [facility]  ELECTRONICALLY SIGNED ON:  01/10/2021, 3:16 PM [place] PH: [phone]   FX: [phone] [place]

## 2021-01-10 NOTE — Procedures (Signed)
     Patient Name: Phyllis Shelton, Strub Date: 01/07/2021 Gender: Female D.O.B: 08/29/1975 Age (years): 1 Referring Provider: Baird Lyons MD, ABSM Height (inches): 59 Interpreting Physician: Baird Lyons MD, ABSM Weight (lbs): 170 RPSGT: Jacolyn Reedy BMI: 34 MRN: [id number] Neck Size: 15.00  CLINICAL INFORMATION Sleep Study Type: MSLT The patient was referred to the sleep center for evaluation of daytime sleepiness. Epworth Sleepiness Score: 16  SLEEP STUDY TECHNIQUE A Multiple Sleep Latency Test was performed after an overnight polysomnogram according to the AASM scoring manual v2.3 (April 2016) and clinical guidelines. Five nap opportunities occurred over the course of the test which followed an overnight polysomnogram. The channels recorded and monitored were frontal, central, and occipital electroencephalography (EEG), right and left electrooculogram (EOG), chin electromyography (EMG), and electrocardiogram (EKG).  MEDICATIONS Medications taken by the patient : none reported Medications administered by patient during sleep study : No sleep medicine administered.  IMPRESSIONS - Total number of naps attempted: 5 . Total number of naps with sleep attained: 5 . The Mean Sleep Latency was 3.42 minutes.  - The patient appears to have pathologic sleepiness, evidenced by a short mean sleep latency (8 minutes or less) on this MSLT. - No sleep onset REMs were noted during this MSLT. Increased REM pressure was suggested on the preceding NPSG, with REM noted to be 31% of total sleep time. - In the appropriate clinical context, these results would be consistent with Narcolepsy.  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  [Electronically signed] 01/10/2021 03:27 PM  Baird Lyons MD, [place], American Board of Sleep Medicine   NPI: [id number]                         [place], [place] [facility]  ELECTRONICALLY SIGNED ON:  01/10/2021, 3:22 PM [place] PH: [phone]   FX: [phone] [place]

## 2021-01-13 ENCOUNTER — Encounter: Payer: Self-pay | Admitting: Family Medicine

## 2021-01-13 ENCOUNTER — Other Ambulatory Visit: Payer: Self-pay | Admitting: Family Medicine

## 2021-01-13 DIAGNOSIS — Z1231 Encounter for screening mammogram for malignant neoplasm of breast: Secondary | ICD-10-CM

## 2021-01-14 ENCOUNTER — Encounter: Payer: Self-pay | Admitting: *Deleted

## 2021-01-20 ENCOUNTER — Encounter: Payer: Self-pay | Admitting: Gastroenterology

## 2021-02-03 ENCOUNTER — Ambulatory Visit (AMBULATORY_SURGERY_CENTER): Payer: BC Managed Care – PPO

## 2021-02-03 ENCOUNTER — Other Ambulatory Visit: Payer: Self-pay

## 2021-02-03 VITALS — Ht 59.0 in | Wt 170.0 lb

## 2021-02-03 DIAGNOSIS — Z1211 Encounter for screening for malignant neoplasm of colon: Secondary | ICD-10-CM

## 2021-02-03 NOTE — Progress Notes (Signed)
Pre visit completed via phone call; Patient verified name, DOB, and address; No egg or soy allergy known to patient  No issues with past sedation with any surgeries or procedures Patient denies ever being told they had issues or difficulty with intubation  No FH of Malignant Hyperthermia No diet pills per patient No home 02 use per patient  No blood thinners per patient  Pt denies issues with constipation  No A fib or A flutter  EMMI video via MyChart  COVID 19 guidelines implemented in PV today with Pt and RN  Pt is fully vaccinated for Covid   NO PA's for preps discussed with pt in PV today  Discussed with pt there will be an out-of-pocket cost for prep and that varies from $0 to 70 dollars   Due to the COVID-19 pandemic we are asking patients to follow certain guidelines.  Pt aware of COVID protocols and [facility] guidelines

## 2021-02-13 ENCOUNTER — Encounter: Payer: Self-pay | Admitting: Gastroenterology

## 2021-02-16 ENCOUNTER — Emergency Department (HOSPITAL_COMMUNITY)
Admission: EM | Admit: 2021-02-16 | Discharge: 2021-02-16 | Disposition: A | Discharge status: Home / Self Care | Payer: BC Managed Care – PPO | Attending: Emergency Medicine | Admitting: Emergency Medicine

## 2021-02-16 ENCOUNTER — Ambulatory Visit: Payer: BC Managed Care – PPO | Admitting: Gastroenterology

## 2021-02-16 ENCOUNTER — Emergency Department (HOSPITAL_COMMUNITY): Payer: BC Managed Care – PPO

## 2021-02-16 ENCOUNTER — Other Ambulatory Visit: Payer: Self-pay

## 2021-02-16 ENCOUNTER — Encounter: Payer: Self-pay | Admitting: Gastroenterology

## 2021-02-16 VITALS — BP 104/66 | HR 88 | Temp 98.0°F | Ht 59.0 in | Wt 170.0 lb

## 2021-02-16 DIAGNOSIS — N9489 Other specified conditions associated with female genital organs and menstrual cycle: Secondary | ICD-10-CM | POA: Insufficient documentation

## 2021-02-16 DIAGNOSIS — R404 Transient alteration of awareness: Secondary | ICD-10-CM | POA: Insufficient documentation

## 2021-02-16 DIAGNOSIS — Z1211 Encounter for screening for malignant neoplasm of colon: Secondary | ICD-10-CM

## 2021-02-16 DIAGNOSIS — R569 Unspecified convulsions: Secondary | ICD-10-CM | POA: Diagnosis present

## 2021-02-16 LAB — MAGNESIUM: Magnesium: 1.8 mg/dL (ref 1.7–2.4)

## 2021-02-16 LAB — CBC WITH DIFFERENTIAL/PLATELET
Abs Immature Granulocytes: 0.04 10*3/uL (ref 0.00–0.07)
Basophils Absolute: 0.1 10*3/uL (ref 0.0–0.1)
Basophils Relative: 1 %
Eosinophils Absolute: 0.1 10*3/uL (ref 0.0–0.5)
Eosinophils Relative: 1 %
HCT: 45.4 % (ref 36.0–46.0)
Hemoglobin: 14.9 g/dL (ref 12.0–15.0)
Immature Granulocytes: 0 %
Lymphocytes Relative: 15 %
Lymphs Abs: 1.7 10*3/uL (ref 0.7–4.0)
MCH: 30.1 pg (ref 26.0–34.0)
MCHC: 32.8 g/dL (ref 30.0–36.0)
MCV: 91.7 fL (ref 80.0–100.0)
Monocytes Absolute: 0.6 10*3/uL (ref 0.1–1.0)
Monocytes Relative: 5 %
Neutro Abs: 8.5 10*3/uL — ABNORMAL HIGH (ref 1.7–7.7)
Neutrophils Relative %: 78 %
Platelets: 237 10*3/uL (ref 150–400)
RBC: 4.95 MIL/uL (ref 3.87–5.11)
RDW: 12.3 % (ref 11.5–15.5)
WBC: 11 10*3/uL — ABNORMAL HIGH (ref 4.0–10.5)
nRBC: 0 % (ref 0.0–0.2)

## 2021-02-16 LAB — COMPREHENSIVE METABOLIC PANEL
ALT: 35 U/L (ref 0–44)
AST: 29 U/L (ref 15–41)
Albumin: 4 g/dL (ref 3.5–5.0)
Alkaline Phosphatase: 82 U/L (ref 38–126)
Anion gap: 12 (ref 5–15)
BUN: 11 mg/dL (ref 6–20)
CO2: 22 mmol/L (ref 22–32)
Calcium: 9.3 mg/dL (ref 8.9–10.3)
Chloride: 104 mmol/L (ref 98–111)
Creatinine, Ser: 0.9 mg/dL (ref 0.44–1.00)
GFR, Estimated: >60 mL/min (ref >60)
Glucose, Bld: 80 mg/dL (ref 70–99)
Potassium: 3.8 mmol/L (ref 3.5–5.1)
Sodium: 138 mmol/L (ref 135–145)
Total Bilirubin: 1.5 mg/dL — ABNORMAL HIGH (ref 0.3–1.2)
Total Protein: 7.2 g/dL (ref 6.5–8.1)

## 2021-02-16 LAB — I-STAT BETA HCG BLOOD, ED (MC, WL, AP ONLY): I-stat hCG, quantitative: <5 m[IU]/mL (ref <5)

## 2021-02-16 MED ORDER — SODIUM CHLORIDE 0.9 % IV SOLN: 500.0000 mL | Freq: Once | INTRAVENOUS | Status: DC

## 2021-02-16 MED ORDER — SODIUM CHLORIDE 0.9 % IV BOLUS
1000.0000 mL | Freq: Once | INTRAVENOUS | Status: AC
  Administered 2021-02-16: 1000 mL via INTRAVENOUS

## 2021-02-16 NOTE — ED Notes (Signed)
ED Provider at bedside. 

## 2021-02-16 NOTE — ED Provider Notes (Signed)
[facility] EMERGENCY DEPARTMENT Provider Note   CSN: [id number] Arrival date & time: 02/16/21  1236     History Chief Complaint  Patient presents with   Seizures    Phyllis Shelton is a 45 y.o. female.  HPI 45 year old female presents with possible seizure.  She was getting a colonoscopy today and while in the waiting area prior to the procedure she all of a sudden started to feel cold and nauseated.  Had a cold sweat.  Felt lightheaded progressively like the walls were closing in.  Felt this way for a couple minutes and thought it might go away so she did not tell anyone.  She then had a "absent seizure" that lasted 30 seconds.  She had a blank stare during this time.  It is unclear whether or not she had a postictal phase.  She has never had seizure before.  She did do the prep last night and vomited once after this.  Right now she feels a little cold but otherwise feels well.  No headache before or after.  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    Patient Active Problem List   Diagnosis Date Noted   Excessive somnolence disorder 02/26/2019   Anxiety and depression 02/07/2019   Tear of lateral meniscus of left knee 07/25/2018   Chronic pain of left knee 07/07/2018   Adenomyosis 07/31/2017   Menorrhagia with regular cycle 07/31/2017   Vitamin D deficiency 11/18/2016   Physical exam 11/12/2015   Obesity (BMI 30-39.9) 07/25/2013   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    Past Surgical History:  Procedure Laterality Date   CESAREAN SECTION     INTRAUTERINE DEVICE (IUD) INSERTION     09-15-17 inserted   WISDOM TOOTH EXTRACTION       OB History     Gravida  2   Para  2   Term  2   Preterm      AB      Living  2      SAB      IAB      Ectopic      Multiple       Live Births  2           Family History  Problem Relation Age of Onset   Osteoporosis Mother        diagnosed at age 57   Cancer Mother        thyroid   Hypertension Father    Aneurysm Father    Alcohol abuse Father    Dementia Maternal Grandmother    Cancer Paternal Grandfather        stomach   Diabetes Paternal Grandmother     Social History   Tobacco Use   Smoking status: Never   Smokeless tobacco: Never  Vaping Use   Vaping Use: Never used  Substance Use Topics   Alcohol use: Yes    Alcohol/week: 1.0 standard drink    Types: 1 Standard drinks or equivalent per week   Drug use: No    Home Medications Prior to Admission medications   Medication Sig Start Date End Date Taking? Authorizing Provider  Cholecalciferol (VITAMIN D3) 10 MCG (400 UNIT) CAPS Take by mouth.   Yes [provider]  levonorgestrel (MIRENA) 20 MCG/24HR IUD 1 each  by Intrauterine route once. Placed 09/15/17    [provider]    Allergies    Darvocet [propoxyphene n-acetaminophen] and Percocet [oxycodone-acetaminophen]  Review of Systems   Review of Systems  Gastrointestinal:  Positive for vomiting.  Musculoskeletal:  Negative for neck pain.  Neurological:  Negative for weakness, numbness and headaches.  All other systems reviewed and are negative.  Physical Exam Updated Vital Signs BP 136/75 (BP Location: Left Arm)   Pulse 68   Temp 99.1 F (37.3 C) (Oral)   Resp 14   Ht '4\' 11"'$  (1.499 m)   Wt 77.1 kg   LMP 01/30/2021 (Approximate)   SpO2 100%   BMI 34.34 kg/m   Physical Exam Vitals and nursing note reviewed.  Constitutional:      Appearance: She is well-developed.  HENT:     Head: Normocephalic and atraumatic.     Right Ear: External ear normal.     Left Ear: External ear normal.     Nose: Nose normal.  Eyes:     General:        Right eye: No discharge.        Left eye: No discharge.     Extraocular Movements: Extraocular movements intact.      Pupils: Pupils are equal, round, and reactive to light.  Cardiovascular:     Rate and Rhythm: Normal rate and regular rhythm.     Heart sounds: Normal heart sounds.  Pulmonary:     Effort: Pulmonary effort is normal.     Breath sounds: Normal breath sounds.  Abdominal:     Palpations: Abdomen is soft.     Tenderness: There is no abdominal tenderness.  Musculoskeletal:     Cervical back: Normal range of motion and neck supple. No rigidity.  Skin:    General: Skin is warm and dry.  Neurological:     Mental Status: She is alert.     Comments: CN 3-12 grossly intact. 5/5 strength in all 4 extremities. Grossly normal sensation. Normal finger to nose.   Psychiatric:        Mood and Affect: Mood is not anxious.    ED Results / Procedures / Treatments   Labs (all labs ordered are listed, but only abnormal results are displayed) Labs Reviewed  COMPREHENSIVE METABOLIC PANEL - Abnormal; Notable for the following components:      Result Value   Total Bilirubin 1.5 (*)    All other components within normal limits  CBC WITH DIFFERENTIAL/PLATELET - Abnormal; Notable for the following components:   WBC 11.0 (*)    Neutro Abs 8.5 (*)    All other components within normal limits  MAGNESIUM  I-STAT BETA HCG BLOOD, ED ([facility], [facility], [facility] ONLY)    EKG EKG Interpretation  Date/Time:  Monday February 16 2021 13:47:57 EDT Ventricular Rate:  66 PR Interval:  106 QRS Duration: 88 QT Interval:  408 QTC Calculation: 427 R Axis:   6 Text Interpretation: Sinus rhythm with short PR Otherwise normal ECG Confirmed by Sherwood Gambler [phone]) on 02/16/2021 2:20:24 PM  Radiology No results found.  Procedures Procedures   Medications Ordered in ED Medications  sodium chloride 0.9 % bolus 1,000 mL (1,000 mLs Intravenous New Bag/Given 02/16/21 1315)    ED Course  I have reviewed the triage vital signs and the nursing notes.  Pertinent labs & imaging results that were available during my care of the patient  were reviewed by me and considered in my medical decision making (see  chart for details).    MDM Rules/Calculators/A&P                           Unclear if patient actually had a seizure as it sounds like she probably had more syncope with some prodromal symptoms.  There is no report of whether or not she had a postictal phase.  Most likely this was syncope but will get CT head.  If this is negative I think she can go home with return precautions and neurology follow-up.  Care to Dr. Sherry Ruffing. Final Clinical Impression(s) / ED Diagnoses Final diagnoses:  None    Rx / DC Orders ED Discharge Orders     None        Sherwood Gambler, MD 02/16/21 1534

## 2021-02-16 NOTE — ED Notes (Signed)
Pt able to tolerate juice and crackers.

## 2021-02-16 NOTE — Progress Notes (Signed)
Pt found staring off, unresponsive, with CRNA opening airway and pt coming back around in pre admission.  Case canceled and sent to ER via EMS due to patient stating she does feel "right, sometime hot and then cold," vss. Dr Tarri Glenn updated.

## 2021-02-16 NOTE — ED Triage Notes (Signed)
Pt to ED via EMS from [facility] c/o seizure. Pt was there to get routine colonscopy , waiting in holding area, and nurse witnessed abscnent like seizure that lasted aprox 30 seconds. Pt  had blank stare.  broke out into cold sweat, nauseated, Pt has no memory of events. Hx: depression. No hx seizure. CBG 98. Last VS: 115/81, hr 80. 100%RA. #22 R hand, 300 ml Fluid Bolus given by EMS.

## 2021-02-16 NOTE — ED Notes (Signed)
Pt d/c home per MD order. Discharge summary reviewed with pt, pt verbalizes understanding. No s/s of acute distress noted at discharge. Ambulatory off unit. Discharged home with visitor.

## 2021-02-16 NOTE — Discharge Instructions (Addendum)
Your history, exam, and work-up today are consistent with a syncopal episode versus a seizure episode while waiting for your procedure.  Based on the story and work-up, it sounds more like a syncopal episode having done the bowel prep, fasting, and likely being dehydrated leading to this prodrome of symptoms before the altered mental status episode however, it was reasonable to make sure there was not any acute abnormality on your CT head today.  Based on the previous team's recommendations and your evaluation, we do feel you are safe for discharge home but please follow-up with outpatient neurology.  Please make sure you are staying hydrated and reschedule any procedures with your primary team.  If any symptoms change or worsen acutely, please return to the nearest emergency department I include information on both syncope (passing out) and seizures however I am more concerned about the lightheadedness and passing out over a seizure episode at this time.

## 2021-02-16 NOTE — ED Provider Notes (Signed)
3:20 PM Care assumed from Dr. Verta Ellen.  At time of transfer of care, patient is awaiting results of CT head prior to reevaluation.  Patient had a syncope versus seizure-like episode today while awaiting her colonoscopy.  Per previous team, sounded more like a near syncopal episode above a seizure episode however, they are getting a CT head and some basic labs.  If work-up is reassuring, anticipate discharge home with plans to follow-up with outpatient neurology and PCP.  5:52 PM CT head was reassuring.  Labs returned confirming she is not pregnant, mild leukocytosis but no anemia.  Normal magnesium level and CMP overall reassuring.  EKG did not show significant arrhythmia.  Patient feeling better after some fluids and monitoring.  Suspect syncopal episode over a seizure episode however we will have her follow-up with outpatient neurology.  Patient did report a family history of aneurysm however given her lack of headache, bleeding on CT, or focal neurologic deficits, low suspicion that she needs a CTA or MRA/MRI tonight.  She may need outpatient imaging as directed by neurology.  Patient agrees with plan of care and was discharged in good condition after p.o. challenge.  Clinical Impression: 1. Altered awareness, transient     Disposition: Discharge  Condition: Good  I have discussed the results, Dx and Tx plan with the pt(& family if present). He/she/they expressed understanding and agree(s) with the plan. Discharge instructions discussed at great length. Strict return precautions discussed and pt &/or family have verbalized understanding of the instructions. No further questions at time of discharge.    New Prescriptions   No medications on file    Follow Up: Midge Minium, MD [address] [place] [address] [phone]     [facility] [address] [place]              Dmiyah Liscano,  Gwenyth Allegra, MD 02/16/21 [phone]

## 2021-02-16 NOTE — ED Notes (Signed)
Pt given apple juice and crackers.  

## 2021-02-16 NOTE — Progress Notes (Signed)
Pt's states no medical or surgical changes since previsit or office visit.   CHECK-IN-AER  V/S-CW

## 2021-02-16 NOTE — Progress Notes (Signed)
HPI F married never smoker with hx snoring, complicated by Obesity, anxiety and  depression, Vitamin D deficiency HST 03/27/2019- AHI 4.3/ hr, desaturation to 85%, body weight 170 lbs NPSG 01/06/21- AHI 0.6/ hr, desaturation to 81%, body weight 170 lbs   Increased REM MSLT 01/07/21- Mean sleep latency abnormal 3:25 minutes, sleep on all 5 naps, No SOREMS, but increased REM on preceeding NPSG. Consistent with Narcoleepsy/ Idiopathic Hypersomnolence. ========================================================= 11/17/20- 24 yoF married never smoker with followed for Somnolence, complicated by Obesity, anxiety and depression, Vitamin D deficiency  Body weight today-168 lbs Covid vax- -----Reports no longer taking aderall.  Requesting another sleep study for daytime fatigue.  Adderall may have helped some, but she "crashed" and felt really tired for a couple of days after stopping. Still distressed by daytime fatigue. Wakes after sleep onset. Again denies cataplexy, sleep paralysis or vivid dreams. After discussion, rather than repeating home sleep test, we are going to get formal NPSG followed by MSLT. She feels she can be off Prozac for this.  02/17/21- 12 yoF married never smoker  followed for Somnolence, complicated by Obesity, anxiety and depression, Vitamin D deficiency  NPSG 01/06/21- AHI 0.6/ hr, desaturation to 81%, body weight 170 lbs   Increased REM MSLT 01/07/21- Mean sleep latency abnormal 3:25 minutes, sleep on all 5 naps, No SOREMS, but increased REM on preceeding NPSG. Consistent with Narcolepsy/ Idiopathic Hypersomnolence. EDvisit 02/16/21-- Altered awareness, transient- syncopal episode vs seizure. Neuro pending. Body weight today-175 lbs Covid vax-3 Phizer She describes episode leading to ED visit more like a faint- definitely not a sleep attack and probably not cataplexy. "Prone to fainting in medical situations". She currently minimizes impact of daytime sleepiness on daily life. May get drowsy  sitting quietly, but denies problem with driving. Adderall let her down hard when she stopped it. We discussed pending visit with Neurology to follow up the ED visit, but she can discuss the Narcolepsy issue with them and decide whether to follow there or here.   ROS-see HPI   + = positive Constitutional:    weight loss, night sweats, fevers, chills, +fatigue, lassitude. HEENT:    headaches, difficulty swallowing, tooth/dental problems, sore throat,       sneezing, itching, ear ache, nasal congestion, post nasal drip, snoring CV:    chest pain, orthopnea, PND, swelling in lower extremities, anasarca,                                   dizziness, palpitations Resp:   shortness of breath with exertion or at rest.                productive cough,   non-productive cough, coughing up of blood.              change in color of mucus.  wheezing.   Skin:    rash or lesions. GI:  + heartburn, indigestion, abdominal pain, nausea, vomiting, diarrhea,                 change in bowel habits, loss of appetite GU: dysuria, change in color of urine, no urgency or frequency.   flank pain. MS:   joint pain, stiffness, decreased range of motion, back pain. Neuro-     nothing unusual Psych:  change in mood or affect.  +depression or +anxiety.   memory loss.  OBJ- Physical Exam General- Alert, Oriented, Affect-appropriate, Distress- none acute, + obese Skin-  rash-none, lesions- none, excoriation- none Lymphadenopathy- none Head- atraumatic            Eyes- Gross vision intact, PERRLA, conjunctivae and secretions clear            Ears- Hearing, canals-normal            Nose- Clear, no-Septal dev, mucus, polyps, erosion, perforation             Throat- Mallampati IV , mucosa clear , drainage- none, tonsils- atrophic Neck- flexible , trachea midline, no stridor , thyroid nl, carotid no bruit Chest - symmetrical excursion , unlabored           Heart/CV- RRR , no murmur , no gallop  , no rub, nl s1 s2                            - JVD- none , edema- none, stasis changes- none, varices- none           Lung- clear to P&A, wheeze- none, cough- none , dullness-none, rub- none           Chest wall-  Abd-  Br/ Gen/ Rectal- Not done, not indicated Extrem- cyanosis- none, clubbing, none, atrophy- none, strength- nl Neuro- grossly intact to observation

## 2021-02-17 ENCOUNTER — Ambulatory Visit: Payer: BC Managed Care – PPO | Admitting: Internal Medicine

## 2021-02-17 ENCOUNTER — Encounter: Payer: Self-pay | Admitting: Internal Medicine

## 2021-02-17 DIAGNOSIS — E669 Obesity, unspecified: Secondary | ICD-10-CM | POA: Diagnosis not present

## 2021-02-17 DIAGNOSIS — G4710 Hypersomnia, unspecified: Secondary | ICD-10-CM

## 2021-02-17 MED ORDER — MODAFINIL 100 MG PO TABS: ORAL_TABLET | ORAL | 1 refills | Status: DC

## 2021-02-17 NOTE — Patient Instructions (Addendum)
Your sleep studies indicated abnormal daytime sleepiness consistent with Narcolepsy.. I doubt this is related to your ER visit. Since you are going to see a Neurologist anyway, I suggest you take copies of your sleep studies to discuss with the Neurologist.  Meanwhile we are going to see how you feel with an allerting medication called modafinil- script sent to yo\ur drug store.  Please call as needed

## 2021-02-17 NOTE — Assessment & Plan Note (Signed)
Goal- maintain normal body weight with diet/ exercise. Avoid further weight gain.

## 2021-02-17 NOTE — Assessment & Plan Note (Signed)
Discussed managing as Narcolepsy- very mild and not very intrusive on her life. Emphasized driving safety. Plan- try modafinil for effect. Go ahead with planned Neurology visit to f/u ED visit, and discuss sleep studies with them.

## 2021-03-06 ENCOUNTER — Other Ambulatory Visit: Payer: Self-pay

## 2021-03-06 ENCOUNTER — Ambulatory Visit
Admission: RE | Admit: 2021-03-06 | Discharge: 2021-03-06 | Disposition: A | Discharge status: Home / Self Care | Payer: BC Managed Care – PPO | Source: Ambulatory Visit

## 2021-03-06 DIAGNOSIS — Z1231 Encounter for screening mammogram for malignant neoplasm of breast: Secondary | ICD-10-CM

## 2021-05-04 ENCOUNTER — Encounter: Payer: BC Managed Care – PPO | Admitting: Gastroenterology

## 2021-05-13 ENCOUNTER — Ambulatory Visit
Admission: EM | Admit: 2021-05-13 | Discharge: 2021-05-13 | Disposition: A | Discharge status: Home / Self Care | Payer: BC Managed Care – PPO | Attending: Emergency Medicine | Admitting: Emergency Medicine

## 2021-05-13 ENCOUNTER — Telehealth: Payer: BC Managed Care – PPO | Admitting: Family Medicine

## 2021-05-13 ENCOUNTER — Other Ambulatory Visit: Payer: Self-pay

## 2021-05-13 DIAGNOSIS — J208 Acute bronchitis due to other specified organisms: Secondary | ICD-10-CM | POA: Diagnosis not present

## 2021-05-13 DIAGNOSIS — R059 Cough, unspecified: Secondary | ICD-10-CM

## 2021-05-13 DIAGNOSIS — R6889 Other general symptoms and signs: Secondary | ICD-10-CM

## 2021-05-13 DIAGNOSIS — R52 Pain, unspecified: Secondary | ICD-10-CM

## 2021-05-13 DIAGNOSIS — R0981 Nasal congestion: Secondary | ICD-10-CM

## 2021-05-13 MED ORDER — METHYLPREDNISOLONE 4 MG PO TBPK: ORAL_TABLET | ORAL | 0 refills | Status: DC

## 2021-05-13 NOTE — ED Provider Notes (Addendum)
[facility]    CSN: [id number] Arrival date & time: 05/13/21  0901      History   Chief Complaint Chief Complaint  Patient presents with   Cough   Headache   Fever    HPI Phyllis Shelton is a 45 y.o. female.   Patient patient reports a 3-day history of fever, productive cough, congestion, body aches, and headache.  Patient states she was seen via telehealth and advised that she should be tested for RSV.  Patient's vital signs are normal on arrival today, patient appears to be in no acute distress.  The history is provided by the patient.   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    Patient Active Problem List   Diagnosis Date Noted   Excessive somnolence disorder 02/26/2019   Anxiety and depression 02/07/2019   Tear of lateral meniscus of left knee 07/25/2018   Chronic pain of left knee 07/07/2018   Adenomyosis 07/31/2017   Menorrhagia with regular cycle 07/31/2017   Vitamin D deficiency 11/18/2016   Physical exam 11/12/2015   Obesity (BMI 30-39.9) 07/25/2013   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 Authorizing Provider  methylPREDNISolone (MEDROL DOSEPAK) 4 MG TBPK tablet Take 24 mg on day 1, 20 mg on day 2, 16 mg on day 3, 12 mg on day 4, 8 mg on day 5, 4 mg on day 6. 05/13/21  Yes Lynden Oxford Scales, PA-C  Cholecalciferol  (VITAMIN D3) 10 MCG (400 UNIT) CAPS Take by mouth.    [provider]  levonorgestrel (MIRENA) 20 MCG/24HR IUD 1 each by Intrauterine route once. Placed 09/15/17    [provider]  modafinil (PROVIGIL) 100 MG tablet 1 each morning as needed 02/17/21   Deneise Lever, MD    Family History Family History  Problem Relation Age of Onset   Osteoporosis Mother        diagnosed at age 42   Cancer Mother        thyroid   Hypertension Father    Aneurysm Father    Alcohol abuse Father    Dementia Maternal Grandmother    Cancer Paternal Grandfather        stomach   Diabetes Paternal Grandmother     Social History Social History   Tobacco Use   Smoking status: Never   Smokeless tobacco: Never  Vaping Use   Vaping Use: Never used  Substance Use Topics   Alcohol use: Yes    Alcohol/week: 1.0 standard drink    Types: 1 Standard  drinks or equivalent per week   Drug use: No     Allergies   Darvocet [propoxyphene n-acetaminophen] and Percocet [oxycodone-acetaminophen]   Review of Systems Review of Systems   Physical Exam Triage Vital Signs ED Triage Vitals  Enc Vitals Group     BP 05/13/21 0927 138/87     Pulse Rate 05/13/21 0927 82     Resp 05/13/21 0927 18     Temp 05/13/21 0927 98.8 F (37.1 C)     Temp Source 05/13/21 0927 Oral     SpO2 05/13/21 0927 95 %     Weight --      Height --      Head Circumference --      Peak Flow --      Pain Score 05/13/21 0926 6     Pain Loc --      Pain Edu? --      Excl. in [place]? --    No data found.  Updated Vital Signs BP 138/87 (BP Location: Right Arm)   Pulse 82   Temp 98.8 F (37.1 C) (Oral)   Resp 18   SpO2 95%   Visual Acuity Right Eye Distance:   Left Eye Distance:   Bilateral Distance:    Right Eye Near:   Left Eye Near:    Bilateral Near:     Physical Exam Vitals and nursing note reviewed.  Constitutional:      General: She is not in acute distress.    Appearance: Normal appearance.  She is not ill-appearing.  HENT:     Head: Normocephalic and atraumatic.     Salivary Glands: Right salivary gland is not diffusely enlarged or tender. Left salivary gland is not diffusely enlarged or tender.     Right Ear: External ear normal. Tenderness present. No drainage. No middle ear effusion (suppurative). Tympanic membrane is not erythematous or bulging.     Left Ear: External ear normal. Tenderness present. No drainage.  No middle ear effusion (suppurative). Tympanic membrane is not erythematous or bulging.     Nose: Mucosal edema, congestion and rhinorrhea present. No nasal deformity or septal deviation. Rhinorrhea is clear.     Right Turbinates: Not enlarged or swollen.     Left Turbinates: Enlarged. Not swollen.     Mouth/Throat:     Lips: Pink. No lesions.     Mouth: Mucous membranes are moist. No oral lesions.     Pharynx: Oropharynx is clear. Uvula midline. No pharyngeal swelling, oropharyngeal exudate, posterior oropharyngeal erythema or uvula swelling.     Tonsils: No tonsillar exudate. 0 on the right. 0 on the left.  Eyes:     General: Lids are normal.        Right eye: No discharge.        Left eye: No discharge.     Extraocular Movements: Extraocular movements intact.     Conjunctiva/sclera: Conjunctivae normal.     Right eye: Right conjunctiva is not injected.     Left eye: Left conjunctiva is not injected.  Neck:     Trachea: Trachea and phonation normal.  Cardiovascular:     Rate and Rhythm: Normal rate and regular rhythm.     Pulses: Normal pulses.     Heart sounds: Normal heart sounds. No murmur heard.   No friction rub. No gallop.  Pulmonary:     Effort: Pulmonary effort is normal. No accessory muscle usage, prolonged expiration or respiratory distress.     Breath sounds: No  stridor, decreased air movement or transmitted upper airway sounds. No decreased breath sounds, wheezing, rhonchi or rales.     Comments: Coarse breath sounds throughout without wheeze,  rale, rhonchi Chest:     Chest wall: No tenderness.  Musculoskeletal:        General: Normal range of motion.     Cervical back: Normal range of motion and neck supple. Normal range of motion.  Lymphadenopathy:     Cervical: No cervical adenopathy.  Skin:    General: Skin is warm and dry.     Findings: No erythema or rash.  Neurological:     General: No focal deficit present.     Mental Status: She is alert and oriented to person, place, and time.  Psychiatric:        Mood and Affect: Mood normal.        Behavior: Behavior normal.     UC Treatments / Results  Labs (all labs ordered are listed, but only abnormal results are displayed) Labs Reviewed  COVID-19, FLU A+B AND RSV    EKG   Radiology No results found.  Procedures Procedures (including critical care time)  Medications Ordered in UC Medications - No data to display  Initial Impression / Assessment and Plan / UC Course  I have reviewed the triage vital signs and the nursing notes.  Pertinent labs & imaging results that were available during my care of the patient were reviewed by me and considered in my medical decision making (see chart for details).     Patient advised that while we are seeing an increased incidence of RSV in older patients, adult patients rarely have complications from RSV and hospitalization is really required.  That being said, I am happy to provide this service for this patient as requested.  I have tested her for COVID, influenza and RSV, she has been advised that the results will be made available to her once received and that at this time no antiviral treatment is available for RSV and she is outside of the window of treatment for both COVID and influenza.  Conservative care is recommended.  For her bronchitis and increased work of coughing, I have prescribed her a Medrol Dosepak to calm her airways, make her cough more productive and hopefully shorten the duration of her  bronchitis.  Patient verbalized understanding and agreement of plan as discussed.  All questions were addressed during visit.  Please see discharge instructions below for further details of plan.  Final Clinical Impressions(s) / UC Diagnoses   Final diagnoses:  Cough, unspecified type  Congestion of nasal sinus  Body aches  Acute viral bronchitis     Discharge Instructions      You have a viral upper respiratory illness to have bronchitis.  You will be notified of the results of the COVID, influenza and RSV testing once available.  At this time, there are no antiviral treatment is recommended for RSV and you are outside of the window for antiviral treatment for COVID and influenza.    I have prescribed a Medrol Dosepak for you to help reduce inflammation in your airways and ease your work of coughing.  When you start taking his medication you may notice a significant increase in mucus production which is a good thing.  Conservative care is the mainstay of treatment for viral upper respiratory infections.  This includes rest, pushing clear fluids and activity as tolerated.  You may also noticed that your appetite is reduced, this is okay  as long as you are drinking plenty of clear fluids.  Acetaminophen: This is a good fever reducer.  If your body temperature rises above 101.5 as measured with a thermometer, it is recommended that you take 1000 mg every 8 hours until your temperature falls below 101.5  Ibuprofen: This is a good anti-inflammatory medication which addresses aches and pains and, to some degree, congestion in the nasal passages.  I recommend taking between 200 to 400 mg every 8 hours as needed.  Pseudoephedrine: This is a decongestant.  This medication has to be purchased from the pharmacist counter, I recommend taking 2 tablets, 60 mg, 2-3 times a day as needed to relieve runny nose and sinus drainage.  Guaifenesin: This is an expectorant.  This helps break up chest  congestion and loosen up thick nasal drainage making phlegm and drainage more liquid and therefore easier to remove.  I recommend taking 400 mg 3 times daily as needed.  Dextromethorphan: This is a cough suppressant.  This is often recommended to be taken at nighttime to suppress cough and help people sleep.      ED Prescriptions     Medication Sig Dispense Auth. Provider   methylPREDNISolone (MEDROL DOSEPAK) 4 MG TBPK tablet Take 24 mg on day 1, 20 mg on day 2, 16 mg on day 3, 12 mg on day 4, 8 mg on day 5, 4 mg on day 6. 21 tablet Lynden Oxford Scales, PA-C      PDMP not reviewed this encounter.   Lynden Oxford Scales, PA-C 05/13/21 [place], [place], PA-C 05/13/21 [phone]

## 2021-05-13 NOTE — ED Triage Notes (Signed)
Patient states on Sunday she began having a fever, productive cough, body aches, and headache (had a teleheath appt today and was told she should be tested for RSV).

## 2021-05-13 NOTE — Discharge Instructions (Addendum)
You have a viral upper respiratory illness to have bronchitis.  You will be notified of the results of the COVID, influenza and RSV testing once available.  At this time, there are no antiviral treatment is recommended for RSV and you are outside of the window for antiviral treatment for COVID and influenza.    I have prescribed a Medrol Dosepak for you to help reduce inflammation in your airways and ease your work of coughing.  When you start taking his medication you may notice a significant increase in mucus production which is a good thing.  Conservative care is the mainstay of treatment for viral upper respiratory infections.  This includes rest, pushing clear fluids and activity as tolerated.  You may also noticed that your appetite is reduced, this is okay as long as you are drinking plenty of clear fluids.  Acetaminophen: This is a good fever reducer.  If your body temperature rises above 101.5 as measured with a thermometer, it is recommended that you take 1000 mg every 8 hours until your temperature falls below 101.5  Ibuprofen: This is a good anti-inflammatory medication which addresses aches and pains and, to some degree, congestion in the nasal passages.  I recommend taking between 200 to 400 mg every 8 hours as needed.  Pseudoephedrine: This is a decongestant.  This medication has to be purchased from the pharmacist counter, I recommend taking 2 tablets, 60 mg, 2-3 times a day as needed to relieve runny nose and sinus drainage.  Guaifenesin: This is an expectorant.  This helps break up chest congestion and loosen up thick nasal drainage making phlegm and drainage more liquid and therefore easier to remove.  I recommend taking 400 mg 3 times daily as needed.  Dextromethorphan: This is a cough suppressant.  This is often recommended to be taken at nighttime to suppress cough and help people sleep.

## 2021-05-13 NOTE — Progress Notes (Signed)
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  UC recommended Pt verbalized understanding

## 2021-05-13 NOTE — Patient Instructions (Signed)
Urgent Care due to possible Flu and or RSV   I appreciate the opportunity to provide you with care for your health and wellness.  I hope you feel better soon!  Please continue to practice social distancing to keep you, your family, and our community safe.  If you must go out, please wear a mask and practice good handwashing.  Have a wonderful day. With Gratitude, Cherly Beach, DNP, AGNP-BC

## 2021-05-14 ENCOUNTER — Encounter: Payer: Self-pay | Admitting: Gastroenterology

## 2021-05-14 ENCOUNTER — Ambulatory Visit (AMBULATORY_SURGERY_CENTER): Payer: BC Managed Care – PPO

## 2021-05-14 ENCOUNTER — Other Ambulatory Visit: Payer: Self-pay

## 2021-05-14 VITALS — Ht 59.0 in | Wt 170.0 lb

## 2021-05-14 DIAGNOSIS — Z1211 Encounter for screening for malignant neoplasm of colon: Secondary | ICD-10-CM

## 2021-05-14 LAB — COVID-19, FLU A+B AND RSV
Influenza A, NAA: DETECTED — AB
Influenza B, NAA: NOT DETECTED
RSV, NAA: NOT DETECTED
SARS-CoV-2, NAA: NOT DETECTED

## 2021-05-14 MED ORDER — PLENVU 140 G PO SOLR: 1.0000 | ORAL | 0 refills | Status: DC

## 2021-05-14 NOTE — Progress Notes (Signed)
    Patient's pre-visit was done today over the phone with the patient   Name,DOB and address verified.   Patient denies any allergies to Eggs and Soy.  Patient denies any problems with anesthesia/sedation. Patient denies taking diet pills or blood thinners.  Denies atrial flutter or atrial fib Denies chronic constipation No home Oxygen.   Packet of Prep instructions mailed to patient including a copy of a consent form-pt is aware.  Patient understands to call us back with any questions or concerns.  Patient is aware of our care-partner policy and [id number] safety protocol.   Syncope incident prior to last colonoscopy attempt.  Was evaluated with no neurological event--pt states they think it was her nerves  Currently she is having some cold symptoms which she notes is day 5.  [place] nurse requested pt to let us know if it does not clear up or if she gets worse.  Pt vocalized her understanding.

## 2021-05-25 ENCOUNTER — Encounter: Payer: Self-pay | Admitting: Neurology

## 2021-05-25 ENCOUNTER — Ambulatory Visit: Payer: BC Managed Care – PPO | Admitting: Neurology

## 2021-05-25 VITALS — BP 148/95 | HR 89 | Ht 59.0 in | Wt 179.5 lb

## 2021-05-25 DIAGNOSIS — R55 Syncope and collapse: Secondary | ICD-10-CM | POA: Diagnosis not present

## 2021-05-25 DIAGNOSIS — G4710 Hypersomnia, unspecified: Secondary | ICD-10-CM

## 2021-05-25 DIAGNOSIS — R442 Other hallucinations: Secondary | ICD-10-CM | POA: Insufficient documentation

## 2021-05-25 DIAGNOSIS — G47411 Narcolepsy with cataplexy: Secondary | ICD-10-CM | POA: Insufficient documentation

## 2021-05-25 NOTE — Patient Instructions (Signed)
Hypersomnia Hypersomnia is a condition in which a person feels very tired during the day even though he or she gets plenty of sleep at night. A person with this condition may take naps during the day and may find it very difficult to wake up from sleep. Hypersomnia may affect a person's ability to think, concentrate, drive, or remember things. What are the causes? The cause of this condition may not be known. Possible causes include: Certain medicines. Sleep disorders, such as narcolepsy and sleep apnea. Injury to the head, brain, or spinal cord. Drug or alcohol use. Gastroesophageal reflux disease (GERD). Tumors. Certain medical conditions, such as depression, diabetes, or an underactive thyroid gland (hypothyroidism). What are the signs or symptoms? The main symptoms of hypersomnia include: Feeling very tired throughout the day, regardless of how much sleep you got the night before. Having trouble waking up. Others may find it difficult to wake you up when you are sleeping. Sleeping for longer and longer periods at a time. Taking naps throughout the day. Other symptoms may include: Feeling restless, anxious, or annoyed. Lacking energy. Having trouble with: Remembering. Speaking. Thinking. Loss of appetite. Seeing, hearing, tasting, smelling, or feeling things that are not real (hallucinations). How is this diagnosed? This condition may be diagnosed based on: Your symptoms and medical history. Your sleeping habits. Your health care provider may ask you to write down your sleeping habits in a daily sleep log, along with any symptoms you have. A series of tests that are done while you sleep (sleep study or polysomnogram). A test that measures how quickly you can fall asleep during the day (daytime nap study or multiple sleep latency test). How is this treated? Treatment can help you manage your condition. Treatment may include: Following a regular sleep routine. Lifestyle changes,  such as changing your eating habits, getting regular exercise, and avoiding alcohol or caffeinated beverages. Taking medicines to make you more alert (stimulants) during the day. Treating any underlying medical causes of hypersomnia. Follow these instructions at home: Sleep routine  Schedule the same bedtime and wake-up time each day. Practice a relaxing bedtime routine. This may include reading, meditation, deep breathing, or taking a warm bath before going to sleep. Get regular exercise each day. Avoid strenuous exercise in the evening hours. Keep your sleep environment at a cooler temperature, darkened, and quiet. Sleep with pillows and a mattress that are comfortable and supportive. Schedule short 20-minute naps for when you feel sleepiest during the day. Talk with your employer or teachers about your hypersomnia. If possible, adjust your schedule so that: You have a regular daytime work schedule. You can take a scheduled nap during the day. You do not have to work or be active at night. Do not eat a heavy meal for a few hours before bedtime. Eat your meals at about the same times every day. Avoid drinking alcohol or caffeinated beverages. Safety  Do not drive or use heavy machinery if you are sleepy. Ask your health care provider if it is safe for you to drive. Wear a life jacket when swimming or spending time near water. General instructions Take supplements and over-the-counter and prescription medicines only as told by your health care provider. Keep a sleep log that will help your doctor manage your condition. This may include information about: What time you go to bed each night. How often you wake up at night. How many hours you sleep at night. How often and for how long you nap during the day.   Any observations from others, such as leg movements during sleep, sleep walking, or snoring. Keep all follow-up visits as told by your health care provider. This is important. Contact  a health care provider if: You have new symptoms. Your symptoms get worse. Get help right away if: You have serious thoughts about hurting yourself or someone else. If you ever feel like you may hurt yourself or others, or have thoughts about taking your own life, get help right away. You can go to your nearest emergency department or call: Your local emergency services (911 in the [place] A suicide crisis helpline, such as the [facility] at 779-531-5682 or 988 in the [place]. This is open 24 hours a day. Summary Hypersomnia refers to a condition in which you feel very tired during the day even though you get plenty of sleep at night. A person with this condition may take naps during the day and may find it very difficult to wake up from sleep. Hypersomnia may affect a person's ability to think, concentrate, drive, or remember things. Treatment, such as following a regular sleep routine and making some lifestyle changes, can help you manage your condition. This information is not intended to replace advice given to you by your health care provider. Make sure you discuss any questions you have with your health care provider. Document Revised: 01/28/2021 Document Reviewed: 05/15/2020 Elsevier Patient Education  2022 [place]. Narcolepsy Narcolepsy is a neurological disorder that causes people to fall asleep suddenly and without control (have sleep attacks) during the daytime. It is a lifelong disorder. Narcolepsy disrupts the sleep cycle at night, which then causes daytime sleepiness. What are the causes? The cause of narcolepsy is not fully understood, but it may be related to: Low levels of hypocretin, a chemical (neurotransmitter) in the brain that controls sleep and wake cycles. Hypocretin imbalance may be caused by: Abnormal genes that are passed from parent to child (inherited). An autoimmune disease in which the body's defense system (immune system) attacks the  brain cells that make hypocretin. Infection, tumor, or injury in the area of the brain that controls sleep. Exposure to poisons (toxins), such as heavy metals, pesticides, and secondhand smoke. What are the signs or symptoms? Symptoms of this condition include: Excessive daytime sleepiness. This is the most common symptom and is usually the first symptom you will notice. This may affect your performance at work or school. Sleep attacks. You may fall asleep in the middle of an activity, especially low-energy activities like reading or watching TV. Feeling like you cannot think clearly and trouble focusing or remembering things. You may also feel depressed. Sudden muscle weakness (cataplexy). When this occurs, your speech may become slurred, or your knees may buckle. Cataplexy is usually triggered by surprise, anger, fear, or laughter. Losing the ability to speak or move (sleep paralysis). This may occur just as you start to fall asleep or wake up. You will be aware of the paralysis. It usually lasts for just a few seconds or minutes. Seeing, hearing, tasting, smelling, or feeling things that are not real (hallucinations). Hallucinations may occur with sleep paralysis. They can happen when you are falling asleep, waking up, or dozing. Trouble staying asleep at night (insomnia) and restless sleep. How is this diagnosed? This condition may be diagnosed based on: A physical exam to rule out any other problems that may be causing your symptoms. You may be asked to write down your sleeping patterns for several weeks in a sleep diary.  This will help your health care provider make a diagnosis. Sleep studies that measure how well your REM sleep is regulated. These tests also measure your heart rate, breathing, movement, and brain waves. These tests include: An overnight sleep study (polysomnogram). A daytime sleep study that is done while you take several naps during the day (multiple sleep latency test,  MSLT). This test measures how quickly you fall asleep and how quickly you enter REM sleep. Removal of spinal fluid to measure hypocretin levels. How is this treated? There is no cure for this condition, but treatment can help relieve symptoms. Treatment may include: Lifestyle and sleeping strategies to help you cope with the condition, such as: Exercising regularly. Maintaining a regular sleep schedule. Avoiding caffeine and large meals before bed. Medicines. These may include: Medicines that help keep you awake and alert (stimulants) to fight daytime sleepiness. Medicines that treat depression (antidepressants). These may be used to treat cataplexy. Sodium oxybate. This is a strong medicine to help you relax (sedative) that you may take at night. It can help control daytime sleepiness and cataplexy. Other treatments may include mental health counseling or joining a support group. Follow these instructions at home: Sleeping habits  Get about 8 hours of sleep every night. Go to sleep and get up at about the same time every day. Keep your bedroom dark, quiet, and comfortable. When you feel very tired, take short naps. Schedule naps so that you take them at about the same time every day. Before bedtime: Avoid bright lights and screens. Relax. Try activities like reading or taking a warm bath. Activity Get at least 20 minutes of exercise every day. This will help you sleep better at night and reduce daytime sleepiness. Avoid exercising within 3 hours of bedtime. Do not drive or use heavy machinery if you are sleepy. If possible, take a nap before driving. Do not swim or go out on the water without a life jacket. Eating and drinking Do not drink alcohol or caffeinated beverages within 4-5 hours of bedtime. Do not eat a large meal before bedtime. Eat meals at about the same times every day. General instructions  Take over-the-counter and prescription medicines only as told by your health  care provider. Keep a sleep diary as told by your health care provider. Tell your employer or teachers that you have narcolepsy. You may be able to adjust your schedule to include time for naps. Do not use any products that contain nicotine or tobacco, such as cigarettes, e-cigarettes, and chewing tobacco. If you need help quitting, ask your health care provider. Keep all follow-up visits as told by your health care provider. This is important. Where to find more information [employer] of Neurological Disorders: [employer] Contact a health care provider if: Your symptoms are not getting better. You have increasingly high blood pressure (hypertension). You have changes in your heart rhythm. You are having a hard time determining what is real and what is not (psychosis). Get help right away if you: Hurt yourself during a sleep attack or an attack of cataplexy. Have chest pain. Have trouble breathing. These symptoms may represent a serious problem that is an emergency. Do not wait to see if the symptoms will go away. Get medical help right away. Call your local emergency services (911 in the [place] Do not drive yourself to the hospital. Summary Narcolepsy is a neurological disorder that causes people to fall asleep suddenly, and without control, during the daytime (sleep attacks). It is a lifelong  disorder. There is no cure for this condition, but treatment can help relieve symptoms. Go to sleep and get up at about the same time every day. Follow instructions about sleep and activities as told by your health care provider. Take over-the-counter and prescription medicines only as told by your health care provider. This information is not intended to replace advice given to you by your health care provider. Make sure you discuss any questions you have with your health care provider. Document Revised: 02/14/2019 Document Reviewed: 02/14/2019 Elsevier Patient Education  [place].

## 2021-05-25 NOTE — Progress Notes (Signed)
SLEEP MEDICINE CLINIC    Provider:  Larey Seat, MD  Primary Care Physician:  Midge Minium, [place] [address] [place] [address]     Referring Provider:          Chief Complaint according to patient   Patient presents with:     New Patient (Initial Visit)           HISTORY OF PRESENT ILLNESS:  Phyllis Shelton is a 45 y.o.  Caucasian female patient seen upon a referral  by ED- here as a referral on 05/25/2021 .  Chief concern according to patient :  "I fainted before colonoscopy, and this occurred at the colonoscopy suite. The nurses  had witnessed her to not respond, while seated. 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 NEEDLE PHOBIA"  she had 4-5 syncopal events starting at age 24 with a tetanus vaccine.  The patient had the first sleep study in the year 2022, had no apnea, and MSLT was abnormal. Per dr Annamaria Boots.     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 All my life sleepier than others, sleeping fine at night. "    Family medical /sleep history:  other family member on CPAP with OSA, insomnia, sleep walkers.    Social history:  Patient is working as a Proofreader, and lives in a household with spouse and children-  persons/ alone. 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  Tobacco use: none .  ETOH use ; sometimes 2 / week at most.,  Caffeine intake in form of Coffee( /) Soda( 3 a week ) Tea ( once a day green tea) or energy drinks.     Sleep habits are as follows:  The patient's dinner time is between 6-7 PM. The patient goes to bed at 9-10 PM and continues to sleep for 6-7 hours, .   The preferred sleep position is lateral, with the support of 2 pillows. Dreams are reportedly frequent/vivid.  Not lucid dreamers.  5 AM is the usual rise time. The patient wakes up with an alarm.  She reports at first feeling refreshed or restored in AM, and later in the morning she  has residual fatigue. Naps are taken infrequently, lasting from 20 to 35 minutes and are more refreshing than nocturnal sleep.    Review of Systems: Out of a complete 14 system review, the patient complains of only the following symptoms, and all other reviewed systems are negative.:  Fatigue, sleepiness    How likely are you to doze in the following situations: 0 = not likely, 1 = slight chance, 2 = moderate chance, 3 = high chance   Sitting and Reading? Watching Television?  Sitting inactive in a public place (theater or meeting)? As a passenger in a car for an hour without a break? Lying down in the afternoon when circumstances permit? Sitting and talking to someone? Sitting quietly after lunch without alcohol? In a car, while stopped for a few minutes in traffic?   Total = 18/ 24 points   FSS endorsed at 37/ 63 points.   Social History   Socioeconomic History   Marital status: Married    Spouse name: Phyllis Shelton   Number of children: 2   Years of education: Not on file   Highest education level: Master's degree (e.g., MA, MS, MEng, MEd, MSW, MBA)  Occupational History   Occupation: Product manager: [employer]  Tobacco Use   Smoking status: Never   Smokeless tobacco: Never  Vaping Use    Vaping Use: Never used  Substance and Sexual Activity   Alcohol use: Yes    Alcohol/week: 1.0 standard drink    Types: 1 Standard drinks or equivalent per week   Drug use: No   Sexual activity: Yes    Partners: Male    Birth control/protection: I.U.D.    Comment: husband vasectomy  Other Topics Concern   Not on file  Social History Narrative   Live at home w husband and 1 child   Right handed   Caffeine: 1 tea/soda a day occ   Social Determinants of Health   Financial Resource Strain: Not on file  Food Insecurity: Not on file  Transportation Needs: Not on file  Physical Activity: Not on file  Stress: Not on file  Social Connections: Not on file    Family History  Problem Relation Age of Onset   Osteoporosis Mother        diagnosed at age 42   Cancer Mother        thyroid   Hypertension Father    Aneurysm Father    Alcohol abuse Father    Dementia Maternal Grandmother    Diabetes Paternal Grandmother    Cancer Paternal Grandfather        stomach   Colon cancer Neg Hx    Colon polyps Neg Hx    Esophageal cancer Neg Hx    Stomach cancer Neg Hx    Rectal cancer Neg Hx     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    Past Surgical History:  Procedure Laterality Date   CESAREAN SECTION     INTRAUTERINE DEVICE (IUD) INSERTION     09-15-17 inserted   WISDOM TOOTH EXTRACTION       Current Outpatient Medications on File Prior to Visit  Medication Sig Dispense Refill   levonorgestrel (MIRENA) 20 MCG/24HR IUD 1 each by Intrauterine route once. Placed 09/15/17     PEG-KCl-NaCl-NaSulf-Na Asc-C (PLENVU) 140 g SOLR Take 1 kit by mouth as directed. 1 each 0   VITAMIN D PO Take by mouth.     modafinil (PROVIGIL) 100 MG tablet 1 each morning as needed (Patient not taking: Reported on 05/25/2021) 30  tablet 1   Current Facility-Administered Medications on File Prior to Visit  Medication Dose Route Frequency Provider Last Rate Last Admin   0.9 %  sodium chloride infusion  500 mL Intravenous Once [place], MD        Allergies  Allergen Reactions   Darvocet [Propoxyphene N-Acetaminophen] Other (See Comments)    Pt states causes her to feel "loopy", hallucinations   Percocet [Oxycodone-Acetaminophen] Other (See Comments)    Pt states causes her to feel "loopy", hallucinations    Physical exam:  Today's Vitals   05/25/21 1415  BP: (!) 148/95  Pulse: 89  Weight: 179 lb 8 oz (81.4 kg)  Height: $Remove'4\' 11"'WYGvnFU$  (1.499 m)   Body mass index is 36.25 kg/m.   Wt Readings from Last 3 Encounters:  05/25/21 179 lb 8 oz (81.4 kg)  05/14/21 170 lb (77.1 kg)  02/17/21 175 lb 3.2 oz (79.5 kg)     Ht Readings from Last 3 Encounters:  05/25/21 $RemoveB'4\' 11"'VxtTcTbU$  (1.499 m)  05/14/21 $RemoveB'4\' 11"'lGLtupBU$  (1.499 m)  02/17/21 $RemoveB'4\' 11"'echPHbwn$  (1.499 m)      General: The patient is awake, alert and appears not in acute distress. The patient is well groomed. Head: Normocephalic, atraumatic. Neck is supple.   Mallampati 3  neck circumference:15 inches . Nasal airflow  patent.  Retrognathia is not seen.  Dental status: braces in teens.  Cardiovascular:  Regular rate and cardiac rhythm by pulse,  without distended neck veins. Respiratory: Lungs are clear to auscultation.  Skin:  Without evidence of ankle edema, or rash. Trunk: The patient's posture is erect.   Neurologic exam : The patient is awake and alert, oriented to place and time.   Memory subjective described as intact.  Attention span & concentration ability appears normal.  Speech is fluent,  without  dysarthria, dysphonia or aphasia.  Mood and affect are appropriate.   Cranial nerves: no loss of smell or taste reported  Pupils are equal and briskly reactive to light. Funduscopic exam deferred. .  Extraocular movements in vertical and horizontal planes were  intact and without nystagmus. No Diplopia. Visual fields by finger perimetry are intact. Hearing was intact to soft voice and finger rubbing.    Facial sensation intact to fine touch.  Facial motor strength is symmetric and tongue and uvula move midline.  Neck ROM : rotation, tilt and flexion extension were normal for age and shoulder shrug was symmetrical.    Motor exam:  Symmetric bulk, tone and ROM.   Normal tone without cog- wheeling, symmetric grip strength .   Sensory:  Fine touch, pinprick and vibration were tested  and  normal.  Proprioception tested in the upper extremities was normal.   Coordination: Rapid alternating movements in the fingers/hands were of normal speed.  The Finger-to-nose maneuver was intact without evidence of ataxia, dysmetria or tremor.   Gait and station: Patient could rise unassisted from a seated position, walked without assistive device.  Stance is of normal width/ base and the patient turned with 3 steps.  Toe and heel walk were deferred.  Deep tendon reflexes: in the  upper and lower extremities are symmetric and intact.  Babinski response was deferred.      After spending a total time of  45  minutes face to face and additional time for physical and neurologic examination, review of laboratory studies,  personal review of imaging studies, reports and results of other testing and review of referral information / records as far as provided in visit, I have established the following assessments:  1) this patient has a needle phobia and all syncopal episodes were related to medical interventions, IV, Tetanus shots and blood draws.   She is neither postictal nor confused , nor injured, no tonic clinic activity was  ever noted .  One " bathroom accident was related to liquid stools. No head injury, and CT was normal ( of course / ) .  2) no MRI yet obtained, but need is questionable. No need for EEG   3) EDS - this patient had no REM onset in 5 naps but  very short mean sleep latency. She had weaned off prozac  more than 2 weeks prior to testing.  She has been hypersensitive to adderall. Modafinil did never get approved. 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 .    My Plan is to proceed with:  1) I will not initiate a seizure work up.  She can have xanax 0.25 mg pre colonscopy, she is neurologically cleared.  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  3) HLA narcolepsy test.  I would like to thank [place], Gwenyth Allegra, Md [address] [place],  [place] [address] for allowing me to meet with and to take care of this pleasant patient.   In short, Jennesis Ramaswamy is presenting with EDS ad with syncope, not seizure   I plan to follow up either personally or through our NP within 2-4 month.   CC: I will share my notes with PCP. Marland Kitchen  Electronically signed by: Larey Seat, MD 05/25/2021 2:34 PM  [facility] Board certified by The [employer] of Sleep Medicine and Diplomate of the [employer] of Sleep Medicine. Board certified In Neurology through the [place], Fellow of the [employer] of Neurology. Medical Director of [facility].

## 2021-05-29 ENCOUNTER — Ambulatory Visit (INDEPENDENT_AMBULATORY_CARE_PROVIDER_SITE_OTHER): Payer: BC Managed Care – PPO | Admitting: Family Medicine

## 2021-05-29 ENCOUNTER — Encounter: Payer: Self-pay | Admitting: Family Medicine

## 2021-05-29 ENCOUNTER — Telehealth: Payer: Self-pay | Admitting: Neurology

## 2021-05-29 VITALS — BP 110/82 | HR 85 | Temp 98.0°F | Resp 16 | Ht 60.25 in | Wt 177.8 lb

## 2021-05-29 DIAGNOSIS — Z0000 Encounter for general adult medical examination without abnormal findings: Secondary | ICD-10-CM

## 2021-05-29 DIAGNOSIS — E559 Vitamin D deficiency, unspecified: Secondary | ICD-10-CM | POA: Diagnosis not present

## 2021-05-29 DIAGNOSIS — E669 Obesity, unspecified: Secondary | ICD-10-CM

## 2021-05-29 LAB — LIPID PANEL
Cholesterol: 193 mg/dL (ref 0–200)
HDL: 48.9 mg/dL (ref >39.00)
LDL Cholesterol: 124 mg/dL — ABNORMAL HIGH (ref 0–99)
NonHDL: 143.99
Total CHOL/HDL Ratio: 4
Triglycerides: 99 mg/dL (ref 0.0–149.0)
VLDL: 19.8 mg/dL (ref 0.0–40.0)

## 2021-05-29 LAB — CBC WITH DIFFERENTIAL/PLATELET
Basophils Absolute: 0.1 10*3/uL (ref 0.0–0.1)
Basophils Relative: 1 % (ref 0.0–3.0)
Eosinophils Absolute: 0.2 10*3/uL (ref 0.0–0.7)
Eosinophils Relative: 2.3 % (ref 0.0–5.0)
HCT: 41.1 % (ref 36.0–46.0)
Hemoglobin: 14 g/dL (ref 12.0–15.0)
Lymphocytes Relative: 21.7 % (ref 12.0–46.0)
Lymphs Abs: 1.9 10*3/uL (ref 0.7–4.0)
MCHC: 34.2 g/dL (ref 30.0–36.0)
MCV: 89 fl (ref 78.0–100.0)
Monocytes Absolute: 0.7 10*3/uL (ref 0.1–1.0)
Monocytes Relative: 8.3 % (ref 3.0–12.0)
Neutro Abs: 5.7 10*3/uL (ref 1.4–7.7)
Neutrophils Relative %: 66.7 % (ref 43.0–77.0)
Platelets: 220 10*3/uL (ref 150.0–400.0)
RBC: 4.62 Mil/uL (ref 3.87–5.11)
RDW: 13.2 % (ref 11.5–15.5)
WBC: 8.5 10*3/uL (ref 4.0–10.5)

## 2021-05-29 LAB — BASIC METABOLIC PANEL
BUN: 15 mg/dL (ref 6–23)
CO2: 26 mEq/L (ref 19–32)
Calcium: 9.4 mg/dL (ref 8.4–10.5)
Chloride: 106 mEq/L (ref 96–112)
Creatinine, Ser: 0.88 mg/dL (ref 0.40–1.20)
GFR: 78.11 mL/min (ref >60.00)
Glucose, Bld: 104 mg/dL — ABNORMAL HIGH (ref 70–99)
Potassium: 4.2 mEq/L (ref 3.5–5.1)
Sodium: 139 mEq/L (ref 135–145)

## 2021-05-29 LAB — HEPATIC FUNCTION PANEL
ALT: 24 U/L (ref 0–35)
AST: 22 U/L (ref 0–37)
Albumin: 4.3 g/dL (ref 3.5–5.2)
Alkaline Phosphatase: 85 U/L (ref 39–117)
Bilirubin, Direct: 0.2 mg/dL (ref 0.0–0.3)
Total Bilirubin: 0.9 mg/dL (ref 0.2–1.2)
Total Protein: 7.1 g/dL (ref 6.0–8.3)

## 2021-05-29 LAB — VITAMIN D 25 HYDROXY (VIT D DEFICIENCY, FRACTURES): VITD: 41.91 ng/mL (ref 30.00–100.00)

## 2021-05-29 LAB — TSH: TSH: 1.1 u[IU]/mL (ref 0.35–5.50)

## 2021-05-29 MED ORDER — ALPRAZOLAM 0.25 MG PO TABS: ORAL_TABLET | ORAL | 0 refills | Status: DC

## 2021-05-29 NOTE — Progress Notes (Signed)
   Subjective:    Patient ID: Phyllis Shelton, female    DOB: 1975-08-24, 45 y.o.   MRN: [id number]  HPI CPE- UTD on mammo, pap.  Colonoscopy is scheduled.  UTD on Tdap, flu.  Patient Care Team    Relationship Specialty Notifications Start End  Midge Minium, MD PCP - General   07/01/10   Regina Eck, CNM Referring Physician Certified Nurse Midwife  11/12/15     Health Maintenance  Topic Date Due   COLONOSCOPY (Pts 45-19yrs Insurance coverage will need to be confirmed)  Never done   COVID-19 Vaccine (4 - Booster for Pfizer series) 11/26/2020   MAMMOGRAM  03/06/2022   PAP SMEAR-Modifier  05/22/2022   TETANUS/TDAP  05/27/2030   INFLUENZA VACCINE  Completed   Hepatitis C Screening  Completed   HIV Screening  Completed   Pneumococcal Vaccine 25-39 Years old  Aged Out   HPV VACCINES  Aged Out      Review of Systems Patient reports no vision/ hearing changes, adenopathy,fever, weight change,  persistant/recurrent hoarseness , swallowing issues, chest pain, palpitations, edema, persistant/recurrent cough, hemoptysis, dyspnea (rest/exertional/paroxysmal nocturnal), gastrointestinal bleeding (melena, rectal bleeding), abdominal pain, significant heartburn, bowel changes, GU symptoms (dysuria, hematuria, incontinence), Gyn symptoms (abnormal  bleeding, pain),  syncope, focal weakness, memory loss, numbness & tingling, skin/hair/nail changes, abnormal bruising or bleeding, anxiety, or depression.   This visit occurred during the SARS-CoV-2 public health emergency.  Safety protocols were in place, including screening questions prior to the visit, additional usage of staff PPE, and extensive cleaning of exam room while observing appropriate contact time as indicated for disinfecting solutions.      Objective:   Physical Exam General Appearance:    Alert, cooperative, no distress, appears stated age, obese  Head:    Normocephalic, without obvious abnormality, atraumatic  Eyes:     PERRL, conjunctiva/corneas clear, EOM's intact, fundi    benign, both eyes  Ears:    Normal TM's and external ear canals, both ears  Nose:   Deferred due to COVID  Throat:   Neck:   Supple, symmetrical, trachea midline, no adenopathy;    Thyroid: no enlargement/tenderness/nodules  Back:     Symmetric, no curvature, ROM normal, no CVA tenderness  Lungs:     Clear to auscultation bilaterally, respirations unlabored  Chest Wall:    No tenderness or deformity   Heart:    Regular rate and rhythm, S1 and S2 normal, no murmur, rub   or gallop  Breast Exam:    Deferred to GYN  Abdomen:     Soft, non-tender, bowel sounds active all four quadrants,    no masses, no organomegaly  Genitalia:    Deferred to GYN  Rectal:    Extremities:   Extremities normal, atraumatic, no cyanosis or edema  Pulses:   2+ and symmetric all extremities  Skin:   Skin color, texture, turgor normal, no rashes or lesions  Lymph nodes:   Cervical, supraclavicular, and axillary nodes normal  Neurologic:   CNII-XII intact, normal strength, sensation and reflexes    throughout          Assessment & Plan:

## 2021-05-29 NOTE — Telephone Encounter (Signed)
Pt states Dr Brett Fairy was to call in Xanax for an upcoming procedure pt is having on Monday morning of next week.   Pt is asking that the Xanax be called into [facility] #[id number]

## 2021-05-29 NOTE — Patient Instructions (Signed)
Follow up in 1 year or as needed We'll notify you of your lab results and make any changes if needed Continue to work on healthy diet and regular exercise- you can do it! Call with any questions or concerns Stay Safe!  Stay Healthy! Happy Holidays!!! 

## 2021-05-29 NOTE — Addendum Note (Signed)
Addended by: Larey Seat on: 05/29/2021 03:28 PM   Modules accepted: Orders

## 2021-05-29 NOTE — Assessment & Plan Note (Signed)
Pt's PE WNL w/ exception of obesity.  UTD on pap, mammo.  Colonoscopy scheduled for Monday.  UTD on Tdap and flu.  Check labs.  Anticipatory guidance provided.

## 2021-05-29 NOTE — Assessment & Plan Note (Signed)
Pt has hx of this.  Check labs and replete prn. 

## 2021-05-29 NOTE — Telephone Encounter (Signed)
Xanax premedication ( for MRI ?) called in. Patient's chart  was reviewed  by PCP today.  Larey Seat, MD

## 2021-05-29 NOTE — Assessment & Plan Note (Signed)
Ongoing issue for pt.  BMI is 34.44.  Encouraged healthy diet and regular exercise.  Check labs to risk stratify.  Will follow.

## 2021-06-01 ENCOUNTER — Ambulatory Visit (AMBULATORY_SURGERY_CENTER): Payer: BC Managed Care – PPO | Admitting: Gastroenterology

## 2021-06-01 ENCOUNTER — Encounter: Payer: Self-pay | Admitting: Gastroenterology

## 2021-06-01 ENCOUNTER — Other Ambulatory Visit: Payer: Self-pay

## 2021-06-01 VITALS — BP 138/75 | HR 94 | Temp 97.6°F | Resp 14 | Ht 59.0 in | Wt 170.0 lb

## 2021-06-01 DIAGNOSIS — Z1211 Encounter for screening for malignant neoplasm of colon: Secondary | ICD-10-CM | POA: Diagnosis present

## 2021-06-01 DIAGNOSIS — D122 Benign neoplasm of ascending colon: Secondary | ICD-10-CM

## 2021-06-01 DIAGNOSIS — K635 Polyp of colon: Secondary | ICD-10-CM

## 2021-06-01 MED ORDER — SODIUM CHLORIDE 0.9 % IV SOLN: 500.0000 mL | Freq: Once | INTRAVENOUS | Status: DC

## 2021-06-01 NOTE — Progress Notes (Signed)
To PACU, VSS. Report to Rn.tb 

## 2021-06-01 NOTE — Progress Notes (Signed)
   Referring Provider: Midge Minium, MD Primary Care Physician:  Midge Minium, MD  Reason for Procedure:  Colon cancer screening   IMPRESSION:  Need for colon cancer screening Appropriate candidate for monitored anesthesia care  PLAN: Colonoscopy in the [place] today   HPI: Phyllis Shelton is a 45 y.o. female presents for screening colonoscopy.  No prior colonoscopy or colon cancer screening.  No baseline GI symptoms.   No known family history of colon cancer or polyps. No family history of uterine/endometrial cancer, pancreatic cancer or gastric/stomach cancer.   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     ALPRAZolam (XANAX) 0.25 MG tablet Pre procedure medication/ sedation (Patient not taking: Reported on 06/01/2021) 8 tablet 0   levonorgestrel (MIRENA) 20 MCG/24HR IUD 1 each by Intrauterine route once. Placed 09/15/17     Current Facility-Administered Medications  Medication Dose Route Frequency Provider Last Rate Last Admin   0.9 %  sodium chloride infusion  500 mL Intravenous Once Thornton Park, MD       0.9 %  sodium chloride infusion  500 mL Intravenous Once Thornton Park, MD        Allergies as of 06/01/2021 - Review Complete 06/01/2021  Allergen Reaction Noted   Darvocet [propoxyphene n-acetaminophen] Other (See Comments) 10/12/2011   Percocet [oxycodone-acetaminophen] Other (See Comments) 02/03/2021    Family History  Problem Relation Age of Onset   Osteoporosis Mother        diagnosed at age 98   Cancer  Mother        thyroid   Hypertension Father    Aneurysm Father    Alcohol abuse Father    Dementia Maternal Grandmother    Diabetes Paternal Grandmother    Cancer Paternal Grandfather        stomach   Colon cancer Neg Hx    Colon polyps Neg Hx    Esophageal cancer Neg Hx    Stomach cancer Neg Hx    Rectal cancer Neg Hx      Physical Exam: General:   Alert,  well-nourished, pleasant and cooperative in NAD Head:  Normocephalic and atraumatic. Eyes:  Sclera clear, no icterus.   Conjunctiva pink. Mouth:  No deformity or lesions.   Neck:  Supple; no masses or thyromegaly. Lungs:  Clear throughout to auscultation.   No wheezes. Heart:  Regular rate and rhythm; no murmurs. Abdomen:  Soft, non-tender, nondistended, normal bowel sounds, no rebound or guarding.  Msk:  Symmetrical. No boney deformities LAD: No inguinal or umbilical LAD Extremities:  No clubbing or edema. Neurologic:  Alert and  oriented x4;  grossly nonfocal Skin:  No obvious rash or bruise. Psych:  Alert and cooperative. Normal mood and affect.     Kaileb Monsanto L. Tarri Glenn, MD, MPH 06/01/2021, 8:49 AM

## 2021-06-01 NOTE — Patient Instructions (Signed)
Handout on polyps given. Await pathology results. Return to previous diet and continue present medications. No repeat colonoscopy for screening purposes for 10 years!!   YOU HAD AN ENDOSCOPIC PROCEDURE TODAY AT [facility]:   Refer to the procedure report that was given to you for any specific questions about what was found during the examination.  If the procedure report does not answer your questions, please call your gastroenterologist to clarify.  If you requested that your care partner not be given the details of your procedure findings, then the procedure report has been included in a sealed envelope for you to review at your convenience later.  YOU SHOULD EXPECT: Some feelings of bloating in the abdomen. Passage of more gas than usual.  Walking can help get rid of the air that was put into your GI tract during the procedure and reduce the bloating. If you had a lower endoscopy (such as a colonoscopy or flexible sigmoidoscopy) you may notice spotting of blood in your stool or on the toilet paper. If you underwent a bowel prep for your procedure, you may not have a normal bowel movement for a few days.  Please Note:  You might notice some irritation and congestion in your nose or some drainage.  This is from the oxygen used during your procedure.  There is no need for concern and it should clear up in a day or so.  SYMPTOMS TO REPORT IMMEDIATELY:  Following lower endoscopy (colonoscopy or flexible sigmoidoscopy):  Excessive amounts of blood in the stool  Significant tenderness or worsening of abdominal pains  Swelling of the abdomen that is new, acute  Fever of 100F or higher  Following upper endoscopy (EGD)  Vomiting of blood or coffee ground material  New chest pain or pain under the shoulder blades  Painful or persistently difficult swallowing  New shortness of breath  Fever of 100F or higher  Black, tarry-looking stools  For urgent or emergent issues, a  gastroenterologist can be reached at any hour by calling [phone]. Do not use MyChart messaging for urgent concerns.    DIET:  We do recommend a small meal at first, but then you may proceed to your regular diet.  Drink plenty of fluids but you should avoid alcoholic beverages for 24 hours.  ACTIVITY:  You should plan to take it easy for the rest of today and you should NOT DRIVE or use heavy machinery until tomorrow (because of the sedation medicines used during the test).    FOLLOW UP: Our staff will call the number listed on your records 48-72 hours following your procedure to check on you and address any questions or concerns that you may have regarding the information given to you following your procedure. If we do not reach you, we will leave a message.  We will attempt to reach you two times.  During this call, we will ask if you have developed any symptoms of COVID 19. If you develop any symptoms (ie: fever, flu-like symptoms, shortness of breath, cough etc.) before then, please call [phone].  If you test positive for Covid 19 in the 2 weeks post procedure, please call and report this information to [place].    If any biopsies were taken you will be contacted by phone or by letter within the next 1-3 weeks.  Please call us at [phone] if you have not heard about the biopsies in 3 weeks.    SIGNATURES/CONFIDENTIALITY: You and/or your care partner have  signed paperwork which will be entered into your electronic medical record.  These signatures attest to the fact that that the information above on your After Visit Summary has been reviewed and is understood.  Full responsibility of the confidentiality of this discharge information lies with you and/or your care-partner. YOU HAD AN ENDOSCOPIC PROCEDURE TODAY AT [facility]:   Refer to the procedure report that was given to you for any specific questions about what was found during the examination.  If the procedure  report does not answer your questions, please call your gastroenterologist to clarify.  If you requested that your care partner not be given the details of your procedure findings, then the procedure report has been included in a sealed envelope for you to review at your convenience later.  YOU SHOULD EXPECT: Some feelings of bloating in the abdomen. Passage of more gas than usual.  Walking can help get rid of the air that was put into your GI tract during the procedure and reduce the bloating. If you had a lower endoscopy (such as a colonoscopy or flexible sigmoidoscopy) you may notice spotting of blood in your stool or on the toilet paper. If you underwent a bowel prep for your procedure, you may not have a normal bowel movement for a few days.  Please Note:  You might notice some irritation and congestion in your nose or some drainage.  This is from the oxygen used during your procedure.  There is no need for concern and it should clear up in a day or so.  SYMPTOMS TO REPORT IMMEDIATELY:  Following lower endoscopy (colonoscopy or flexible sigmoidoscopy):  Excessive amounts of blood in the stool  Significant tenderness or worsening of abdominal pains  Swelling of the abdomen that is new, acute  Fever of 100F or higher  For urgent or emergent issues, a gastroenterologist can be reached at any hour by calling [phone]. Do not use MyChart messaging for urgent concerns.    DIET:  We do recommend a small meal at first, but then you may proceed to your regular diet.  Drink plenty of fluids but you should avoid alcoholic beverages for 24 hours.  ACTIVITY:  You should plan to take it easy for the rest of today and you should NOT DRIVE or use heavy machinery until tomorrow (because of the sedation medicines used during the test).    FOLLOW UP: Our staff will call the number listed on your records 48-72 hours following your procedure to check on you and address any questions or concerns that  you may have regarding the information given to you following your procedure. If we do not reach you, we will leave a message.  We will attempt to reach you two times.  During this call, we will ask if you have developed any symptoms of COVID 19. If you develop any symptoms (ie: fever, flu-like symptoms, shortness of breath, cough etc.) before then, please call [phone].  If you test positive for Covid 19 in the 2 weeks post procedure, please call and report this information to [place].    If any biopsies were taken you will be contacted by phone or by letter within the next 1-3 weeks.  Please call us at [phone] if you have not heard about the biopsies in 3 weeks.    SIGNATURES/CONFIDENTIALITY: You and/or your care partner have signed paperwork which will be entered into your electronic medical record.  These signatures attest to the fact that  that the information above on your After Visit Summary has been reviewed and is understood.  Full responsibility of the confidentiality of this discharge information lies with you and/or your care-partner.

## 2021-06-01 NOTE — Op Note (Signed)
[facility] Patient Name: Phyllis Shelton Procedure Date: 06/01/2021 8:40 AM MRN: [id number] Endoscopist: Thornton Park MD, MD Age: 45 Referring MD:  Date of Birth: 1976-06-21 Gender: Female Account #: [id number] Procedure:                Colonoscopy Indications:              Screening for colorectal malignant neoplasm, This                            is the patient's first colonoscopy                           No known family history of colon cancer or polyps Medicines:                Monitored Anesthesia Care Procedure:                Pre-Anesthesia Assessment:                           - Prior to the procedure, a History and Physical                            was performed, and patient medications and                            allergies were reviewed. The patient's tolerance of                            previous anesthesia was also reviewed. The risks                            and benefits of the procedure and the sedation                            options and risks were discussed with the patient.                            All questions were answered, and informed consent                            was obtained. Prior Anticoagulants: The patient has                            taken no previous anticoagulant or antiplatelet                            agents. ASA Grade Assessment: II - A patient with                            mild systemic disease. After reviewing the risks                            and benefits, the patient was deemed in  satisfactory condition to undergo the procedure.                           After obtaining informed consent, the colonoscope                            was passed under direct vision. Throughout the                            procedure, the patient's blood pressure, pulse, and                            oxygen saturations were monitored continuously. The                            CF [id number] #[id number]  was introduced through the anus                            and advanced to the 3 cm into the ileum. A second                            forward view of the right colon was performed. The                            colonoscopy was performed without difficulty. The                            patient tolerated the procedure well. The quality                            of the bowel preparation was good. The terminal                            ileum, ileocecal valve, appendiceal orifice, and                            rectum were photographed. Scope In: 8:54:28 AM Scope Out: 9:09:58 AM Scope Withdrawal Time: 0 hours 9 minutes 3 seconds  Total Procedure Duration: 0 hours 15 minutes 30 seconds  Findings:                 The perianal and digital rectal examinations were                            normal.                           A 1 mm polyp was found in the ascending colon. The                            polyp was sessile. The polyp was removed with a                            cold snare. Resection and retrieval were  complete.                            Estimated blood loss was minimal.                           The exam was otherwise without abnormality on                            direct and retroflexion views. Complications:            No immediate complications. Estimated blood loss:                            Minimal. Estimated Blood Loss:     Estimated blood loss was minimal. Impression:               - One 1 mm polyp in the ascending colon, removed                            with a cold snare. Resected and retrieved.                           - The examination was otherwise normal on direct                            and retroflexion views. Recommendation:           - Patient has a contact number available for                            emergencies. The signs and symptoms of potential                            delayed complications were discussed with the                             patient. Return to normal activities tomorrow.                            Written discharge instructions were provided to the                            patient.                           - Resume previous diet.                           - Continue present medications.                           - Await pathology results.                           - Repeat colonoscopy date to be determined after  pending pathology results are reviewed for                            surveillance.                           - Emerging evidence supports eating a diet of                            fruits, vegetables, grains, calcium, and yogurt                            while reducing red meat and alcohol may reduce the                            risk of colon cancer.                           - Thank you for allowing me to be involved in your                            colon cancer prevention. Thornton Park MD, MD 06/01/2021 9:15:20 AM This report has been signed electronically.

## 2021-06-01 NOTE — Progress Notes (Signed)
Called to room to assist during endoscopic procedure.  Patient ID and intended procedure confirmed with present staff. Received instructions for my participation in the procedure from the performing physician.  

## 2021-06-03 ENCOUNTER — Telehealth: Payer: Self-pay | Admitting: *Deleted

## 2021-06-03 NOTE — Addendum Note (Signed)
Addended by: Darleen Crocker on: 06/03/2021 11:55 AM   Modules accepted: Orders

## 2021-06-03 NOTE — Telephone Encounter (Signed)
  Follow up Call-  Call back number 06/01/2021 02/16/2021  Post procedure Call Back phone  # [phone]  Permission to leave phone message Yes Yes  Some recent data might be hidden     Patient questions:  Do you have a fever, pain , or abdominal swelling? No. Pain Score  0 *  Have you tolerated food without any problems? Yes.    Have you been able to return to your normal activities? Yes.    Do you have any questions about your discharge instructions: Diet   No. Medications  No. Follow up visit  No.  Do you have questions or concerns about your Care? No.  Actions: * If pain score is 4 or above: No action needed, pain <4.  Have you developed a fever since your procedure? no  2.   Have you had an respiratory symptoms (SOB or cough) since your procedure? no  3.   Have you tested positive for COVID 19 since your procedure no  4.   Have you had any family members/close contacts diagnosed with the COVID 19 since your procedure?  no   If yes to any of these questions please route to Joylene John, RN and Joella Prince, RN

## 2021-06-04 LAB — NARCOLEPSY EVALUATION
DQA1*01:02: NEGATIVE
DQB1*06:02: NEGATIVE

## 2021-06-04 MED ORDER — ALPRAZOLAM 0.25 MG PO TABS: ORAL_TABLET | ORAL | 0 refills | Status: DC

## 2021-06-04 NOTE — Addendum Note (Signed)
Addended by: Larey Seat on: 06/04/2021 01:15 PM   Modules accepted: Orders

## 2021-06-05 ENCOUNTER — Telehealth: Payer: Self-pay

## 2021-06-05 NOTE — Telephone Encounter (Signed)
Patient aware of lab results.

## 2021-06-05 NOTE — Telephone Encounter (Signed)
-----   Message from [place], [place] sent at 06/05/2021 11:59 AM EST -----  ----- Message ----- From: Midge Minium, MD Sent: 06/01/2021   2:53 PM EST To: [name], [place] look good!  LDL (bad cholesterol) has increased by about 20 points but this will improve w/ healthy diet and regular exercise.  You can do it!

## 2021-06-08 ENCOUNTER — Encounter: Payer: Self-pay | Admitting: Neurology

## 2021-06-08 NOTE — Progress Notes (Signed)
Negative HLA narcolepsy test

## 2021-06-11 ENCOUNTER — Encounter: Payer: Self-pay | Admitting: Gastroenterology

## 2021-08-03 ENCOUNTER — Ambulatory Visit: Payer: BC Managed Care – PPO | Admitting: Family Medicine

## 2021-08-03 ENCOUNTER — Encounter: Payer: Self-pay | Admitting: Family Medicine

## 2021-08-03 VITALS — BP 122/82 | HR 86 | Temp 97.9°F | Resp 16 | Wt 182.6 lb

## 2021-08-03 DIAGNOSIS — F32A Depression, unspecified: Secondary | ICD-10-CM

## 2021-08-03 DIAGNOSIS — F419 Anxiety disorder, unspecified: Secondary | ICD-10-CM | POA: Diagnosis not present

## 2021-08-03 MED ORDER — ALPRAZOLAM 0.5 MG PO TABS: 0.5000 mg | ORAL_TABLET | Freq: Two times a day (BID) | ORAL | 1 refills | Status: DC | PRN

## 2021-08-03 MED ORDER — FLUOXETINE HCL 20 MG PO CAPS: 20.0000 mg | ORAL_CAPSULE | Freq: Every day | ORAL | 3 refills | Status: DC

## 2021-08-03 NOTE — Assessment & Plan Note (Signed)
Deteriorated.  Pt has struggled w/ depression before but is not used to struggling w/ anxiety to this extent.  She feels that she worries about 'everything' and it has greatly impacted her ability to sleep.  She feels 'wound' all the time.  Was previously on Fluoxetine and felt 'good' so will restart at 20mg  daily.  Will also provide low dose Alprazolam to use prn in panicked moments.  Pt expressed understanding and is in agreement w/ plan.

## 2021-08-03 NOTE — Progress Notes (Signed)
° °  Subjective:    Patient ID: Phyllis Shelton, female    DOB: Nov 16, 1975, 46 y.o.   MRN: [id number]  HPI Anxiety/depression- PHQ=10 today.  'I'm so worried about everything.  All the time'.  This is unusual for pt.  Son is taking a semester off and having a hard time finding a job.  Currently in grad school- last semester was very difficulty and she feels this semester will be worse.  Husband feels she needs anxiety meds.  Difficulty falling asleep.  Waking from nightmares.  Pt was expecting to be able to relax over the holidays but instead found anxiety was even worse.  Pt reports feeling really wound up.  Has a therapist that she sees weekly.  Pt was previously on Sertraline and Fluoxetine.  Pt reports tolerating Fluoxetine in the past w/o difficulty- 'it made me feel normal'   Review of Systems For ROS see HPI   This visit occurred during the SARS-CoV-2 public health emergency.  Safety protocols were in place, including screening questions prior to the visit, additional usage of staff PPE, and extensive cleaning of exam room while observing appropriate contact time as indicated for disinfecting solutions.      Objective:   Physical Exam Vitals reviewed.  Constitutional:      General: She is not in acute distress.    Appearance: Normal appearance. She is obese. She is not ill-appearing.  HENT:     Head: Normocephalic and atraumatic.  Eyes:     Extraocular Movements: Extraocular movements intact.     Conjunctiva/sclera: Conjunctivae normal.     Pupils: Pupils are equal, round, and reactive to light.  Skin:    General: Skin is warm and dry.  Neurological:     General: No focal deficit present.     Mental Status: She is alert and oriented to person, place, and time.     Cranial Nerves: No cranial nerve deficit.     Gait: Gait normal.  Psychiatric:        Mood and Affect: Mood normal.        Behavior: Behavior normal.        Thought Content: Thought content normal.           Assessment & Plan:

## 2021-08-03 NOTE — Patient Instructions (Signed)
Follow up in 4-6 weeks to recheck anxiety START the Fluoxetine once daily USE the Alprazolam as needed for those high stress moments as a rescue medication Continue to work with your therapist Call with any questions or concerns Stay Safe!  Stay Healthy! Hang in there!!!

## 2021-08-31 ENCOUNTER — Ambulatory Visit: Payer: BC Managed Care – PPO | Admitting: Neurology

## 2021-09-10 ENCOUNTER — Ambulatory Visit: Payer: BC Managed Care – PPO | Admitting: Family Medicine

## 2021-09-10 ENCOUNTER — Encounter: Payer: Self-pay | Admitting: Family Medicine

## 2021-09-10 VITALS — BP 112/72 | HR 83 | Temp 98.2°F | Resp 16 | Wt 173.6 lb

## 2021-09-10 DIAGNOSIS — F419 Anxiety disorder, unspecified: Secondary | ICD-10-CM

## 2021-09-10 DIAGNOSIS — F32A Depression, unspecified: Secondary | ICD-10-CM

## 2021-09-10 NOTE — Progress Notes (Signed)
° °  Subjective:    Patient ID: Phyllis Shelton, female    DOB: [birth year], 46 y.o.   MRN: [id number]  HPI Anxiety- at last visit we started Fluoxetine 20mg  daily.  Pt reports feeling 'amazing'.  'i'm great'.  Pt reports medication has made a big change.  Stress has not changed but she feels better able to manage stress.  Denies side effects.  Sleeping better.  Pt has rarely required Alprazolam.   Review of Systems For ROS see HPI   This visit occurred during the SARS-CoV-2 public health emergency.  Safety protocols were in place, including screening questions prior to the visit, additional usage of staff PPE, and extensive cleaning of exam room while observing appropriate contact time as indicated for disinfecting solutions.      Objective:   Physical Exam Vitals reviewed.  Constitutional:      Appearance: Normal appearance. She is obese. She is not ill-appearing.  Skin:    General: Skin is warm and dry.  Neurological:     General: No focal deficit present.     Mental Status: She is alert and oriented to person, place, and time.     Cranial Nerves: No cranial nerve deficit.     Sensory: No sensory deficit.     Motor: No weakness.     Coordination: Coordination normal.  Psychiatric:        Mood and Affect: Mood normal.        Behavior: Behavior normal.        Thought Content: Thought content normal.          Assessment & Plan:

## 2021-09-10 NOTE — Patient Instructions (Addendum)
Follow up as scheduled or as needed No med changes at this time Keep up the good work!  You're doing great!! Call with any questions or concerns Stay Safe!  Stay Healthy! Happy Early Rudene Anda!!!

## 2021-09-10 NOTE — Assessment & Plan Note (Signed)
Much improved w/ addition of Fluoxetine.  Pt reports feeling great and those around her have noticed a big difference. Has only required Alprazolam x3.  No med changes at this time.  Will follow.

## 2021-10-09 ENCOUNTER — Ambulatory Visit: Payer: Self-pay

## 2021-10-09 ENCOUNTER — Other Ambulatory Visit: Payer: Self-pay

## 2021-10-09 ENCOUNTER — Other Ambulatory Visit: Payer: Self-pay | Admitting: Nurse Practitioner

## 2021-10-09 DIAGNOSIS — M25532 Pain in left wrist: Secondary | ICD-10-CM

## 2021-10-09 DIAGNOSIS — M25522 Pain in left elbow: Secondary | ICD-10-CM

## 2021-10-09 DIAGNOSIS — M79632 Pain in left forearm: Secondary | ICD-10-CM

## 2021-11-24 ENCOUNTER — Other Ambulatory Visit: Payer: Self-pay

## 2021-11-24 DIAGNOSIS — F419 Anxiety disorder, unspecified: Secondary | ICD-10-CM

## 2021-11-24 MED ORDER — FLUOXETINE HCL 20 MG PO CAPS: 20.0000 mg | ORAL_CAPSULE | Freq: Every day | ORAL | 3 refills | Status: DC

## 2022-01-21 ENCOUNTER — Other Ambulatory Visit: Payer: Self-pay | Admitting: Family Medicine

## 2022-01-21 DIAGNOSIS — Z1231 Encounter for screening mammogram for malignant neoplasm of breast: Secondary | ICD-10-CM

## 2022-02-06 ENCOUNTER — Encounter: Payer: Self-pay | Admitting: Family Medicine

## 2022-02-08 ENCOUNTER — Ambulatory Visit: Payer: BC Managed Care – PPO | Admitting: Family Medicine

## 2022-02-17 ENCOUNTER — Ambulatory Visit: Payer: BC Managed Care – PPO | Admitting: Family Medicine

## 2022-02-25 ENCOUNTER — Other Ambulatory Visit: Payer: Self-pay

## 2022-02-25 DIAGNOSIS — F419 Anxiety disorder, unspecified: Secondary | ICD-10-CM

## 2022-02-25 DIAGNOSIS — F32A Depression, unspecified: Secondary | ICD-10-CM

## 2022-02-25 MED ORDER — FLUOXETINE HCL 20 MG PO CAPS: 20.0000 mg | ORAL_CAPSULE | Freq: Every day | ORAL | 3 refills | Status: DC

## 2022-03-08 ENCOUNTER — Ambulatory Visit
Admission: RE | Admit: 2022-03-08 | Discharge: 2022-03-08 | Disposition: A | Discharge status: Home / Self Care | Payer: BC Managed Care – PPO | Source: Ambulatory Visit

## 2022-03-08 DIAGNOSIS — Z1231 Encounter for screening mammogram for malignant neoplasm of breast: Secondary | ICD-10-CM

## 2022-04-19 ENCOUNTER — Ambulatory Visit: Payer: BC Managed Care – PPO | Admitting: Family Medicine

## 2022-04-19 ENCOUNTER — Encounter: Payer: Self-pay | Admitting: Family Medicine

## 2022-04-19 VITALS — BP 106/72 | HR 86 | Temp 98.1°F | Resp 16 | Ht 59.0 in | Wt 176.1 lb

## 2022-04-19 DIAGNOSIS — G8929 Other chronic pain: Secondary | ICD-10-CM

## 2022-04-19 DIAGNOSIS — M545 Low back pain, unspecified: Secondary | ICD-10-CM | POA: Diagnosis not present

## 2022-04-19 DIAGNOSIS — Z23 Encounter for immunization: Secondary | ICD-10-CM | POA: Diagnosis not present

## 2022-04-19 MED ORDER — MELOXICAM 15 MG PO TABS: 15.0000 mg | ORAL_TABLET | Freq: Every day | ORAL | 0 refills | Status: DC

## 2022-04-19 NOTE — Progress Notes (Signed)
   Subjective:    Patient ID: Phyllis Shelton, female    DOB: 1976/05/06, 46 y.o.   MRN: [id number]  HPI Back pain- pt reports sxs started 'at least 6 months ago'.  Pain is localized to R lower back.  Got a new mattress w/o relief.  Doesn't wake w/ pain but will develop during the day.  Improves w/ Advil.  'not debilitating'.  Pt feels pain may worsen after eating but isn't sure.  No relation to bowel movement.  No radiation of pain.  No TTP.  Pain doesn't change w/ movement but will improve w/ pressure and heat.  No known injury.  No urinary sxs- burning, frequency, urgency, blood.  Pain has never woken her from sleep.  Feels it may have been a gradual onset but can't be sure.  No hx of similar.  No repetitive motion.   Review of Systems For ROS see HPI     Objective:   Physical Exam Vitals reviewed.  Constitutional:      General: She is not in acute distress.    Appearance: Normal appearance. She is not ill-appearing.  HENT:     Head: Normocephalic and atraumatic.  Eyes:     Extraocular Movements: Extraocular movements intact.     Conjunctiva/sclera: Conjunctivae normal.     Pupils: Pupils are equal, round, and reactive to light.  Musculoskeletal:        General: Tenderness (TTP over R PSIS) present. Normal range of motion.  Skin:    General: Skin is warm and dry.  Neurological:     General: No focal deficit present.     Mental Status: She is alert and oriented to person, place, and time.     Comments: (-) SLR  Psychiatric:        Mood and Affect: Mood normal.        Behavior: Behavior normal.        Thought Content: Thought content normal.           Assessment & Plan:  Chronic R LBP- new to provider, ongoing for pt for at least 6 months.  She points directly over her R PSIS.  + TTP.  No pain w/ rotation, back flexion or extension, or lateral bending.  (-) SLR.  Start scheduled NSAID and refer to PT for complete evaluation and tx.  If no improvement, will need to see  Sports Med or Ortho.  Pt expressed understanding and is in agreement w/ plan.

## 2022-04-19 NOTE — Patient Instructions (Addendum)
Follow up as needed or as scheduled START the Meloxicam once daily- take w/ food We'll call you to schedule your PT appts Call with any questions or concerns Stay Safe!  Stay Healthy! Hang in there!!!

## 2022-04-27 ENCOUNTER — Other Ambulatory Visit: Payer: Self-pay

## 2022-04-27 ENCOUNTER — Encounter: Payer: Self-pay | Admitting: Rehabilitative and Restorative Service Providers"

## 2022-04-27 ENCOUNTER — Ambulatory Visit: Payer: BC Managed Care – PPO | Admitting: Rehabilitative and Restorative Service Providers"

## 2022-04-27 ENCOUNTER — Ambulatory Visit (INDEPENDENT_AMBULATORY_CARE_PROVIDER_SITE_OTHER): Payer: BC Managed Care – PPO | Admitting: Rehabilitative and Restorative Service Providers"

## 2022-04-27 DIAGNOSIS — M5459 Other low back pain: Secondary | ICD-10-CM | POA: Diagnosis not present

## 2022-04-27 NOTE — Therapy (Addendum)
OUTPATIENT PHYSICAL THERAPY EVALUATION Discharge Summary   Patient Name: Phyllis Shelton MRN: [id number] DOB:10-Aug-1975, 46 y.o., female Today's Date: 04/27/2022  END OF SESSION:   PT End of Session - 04/27/22 1429     Visit Number 1    Number of Visits 20    Date for PT Re-Evaluation 07/06/22    Authorization Type [employer] State $72 copay    Progress Note Due on Visit 10    PT Start Time 1431    PT Stop Time 1505    PT Time Calculation (min) 34 min    Activity Tolerance Patient tolerated treatment well    Behavior During Therapy WFL for tasks assessed/performed             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   Past Surgical History:  Procedure Laterality Date   CESAREAN SECTION     INTRAUTERINE DEVICE (IUD) INSERTION     09-15-17 inserted   WISDOM TOOTH EXTRACTION     Patient Active Problem List   Diagnosis Date Noted   Cataplexy 05/25/2021   Syncope with normal neurologic examination 05/25/2021   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   Excessive somnolence disorder 02/26/2019   Anxiety and depression 02/07/2019   Tear of lateral meniscus of left knee 07/25/2018   Chronic pain of left knee 07/07/2018   Adenomyosis 07/31/2017   Menorrhagia with regular cycle 07/31/2017   Vitamin D deficiency 11/18/2016   Physical exam 11/12/2015   Obesity (BMI 30-39.9) 07/25/2013   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 6 months (April 2023 range)  SUBJECTIVE:                                                                                                                                                                                            SUBJECTIVE STATEMENT: Pt indicated complaints in lower Rt lumbar region.  Pt indicated pain is intermittent and improves with some medicine use.  Pt indicated she can continue to do things when pain is noted.   Insidious onset.  Pt indicated no previous history of back pain.  No numbness/tingling in legs.   PERTINENT HISTORY:  GERD in medical history.   PAIN:  NPRS scale: current 1/10, at worst 5/10 Pain location: low back  Pain description: discomfort Aggravating factors: end of day feelings Relieving factors: medicine, heating pad  PRECAUTIONS: None  WEIGHT BEARING RESTRICTIONS No  FALLS:  Has patient fallen in last 6 months? 2 falls at work, no injury.  No fear of falling reported  LIVING ENVIRONMENT:  Lives in: House/apartment   OCCUPATION: Librarian in school (standing/walking all day)  PLOF: Independent, hiking, reading, napping.  Member at [employer]  :  Reduce pain, she wants to work out more.    OBJECTIVE:   PATIENT SURVEYS:  04/27/2022 FOTO intake: 77   predicted:  79  SCREENING FOR RED FLAGS: 04/27/2022 Bowel or bladder incontinence: No Cauda equina syndrome: No  COGNITION: 04/27/2022  Overall cognitive status: WFL normal    SENSATION: 04/27/2022 WFL  MUSCLE LENGTH: 04/27/2022 No specific testing today  POSTURE:  04/27/2022 unremarkable for lateral shift in standing  PALPATION: 04/27/2022 Specific tenderness Rt QL muscle belly, trigger points noted  LUMBAR ROM:    AROM  04/27/2022  Flexion To ankle joints with hands, no complaints of pain  Extension 75 % WFL with ERP   Right lateral flexion Lateral head of fibula no complaint  Left lateral flexion Lateral head of fibula no complaint c mild pain  Right rotation   Left rotation    (Blank rows = not tested)  LOWER EXTREMITY ROM:       Right 04/27/2022 Left 04/27/2022  Hip flexion    Hip extension    Hip abduction    Hip adduction    Hip internal rotation    Hip external rotation    Knee flexion    Knee extension    Ankle dorsiflexion    Ankle plantarflexion    Ankle inversion    Ankle eversion     (Blank rows = not tested)  LOWER EXTREMITY MMT:    MMT Right 04/27/2022 Left 04/27/2022  Hip flexion 5/5 5/5  Hip extension    Hip abduction    Hip adduction    Hip internal rotation    Hip external rotation    Knee flexion 5/5 5/5  Knee extension 5/5 5/5  Ankle dorsiflexion 5/5 5/5  Ankle plantarflexion    Ankle inversion    Ankle eversion     (Blank rows = not tested)  LUMBAR SPECIAL TESTS:  04/27/2022 (-) slump bilateral  FUNCTIONAL TESTS:  04/27/2022 18 inch chair transfer s UE assist:  on 1st try no compliants  GAIT: 04/27/2022 Independent ambulation within clinic     TODAY'S TREATMENT  04/27/2022 Therex:    HEP instruction/performance c cues for techniques, handout provided.  Trial set performed of each for comprehension and symptom assessment.  See below for exercise list  Manual: Lt sidelying regional rotation g3 lumbar mobilizations    PATIENT EDUCATION:  Education details: HEP, POC Person educated: Patient Education method: Explanation, Demonstration, Verbal cues, and Handouts Education comprehension: verbalized understanding, returned demonstration, and verbal cues required   HOME EXERCISE PROGRAM: Access Code: [email or web address] URL: [email or web address] Date: 04/27/2022 Prepared by: Scot Jun  Exercises - Supine Bridge  - 1-2 x daily - 7 x weekly - 1-2 sets - 10 reps - 2 hold - Supine Lower Trunk Rotation  - 1-2 x daily - 7 x weekly - 1 sets - 3-5 reps - 15 hold - Sidelying Lumbar Rotation Stretch  - 1-2 x daily - 7 x weekly - 1 sets - 5 reps - 15 hold -  Seated Multifidi Isometric  - 1-2 x daily - 7 x weekly - 1 sets - 10 reps - 5-10 hold - Standing Hip  Hiking  - 1-2 x daily - 7 x weekly - 1 sets - 10 reps - 5 hold  ASSESSMENT:  CLINICAL IMPRESSION: Patient is a 46 y.o. who comes to clinic with complaints of low back pain with mobility, strength and movement coordination deficits that impair their ability to perform usual daily and recreational functional activities without increase difficulty/symptoms at this time.  Patient to benefit from skilled PT services to address impairments and limitations to improve to previous level of function without restriction secondary to condition.    OBJECTIVE IMPAIRMENTS decreased endurance, decreased mobility, decreased ROM, hypomobility, increased fascial restrictions, impaired perceived functional ability, increased muscle spasms, impaired flexibility, improper body mechanics, and pain.   ACTIVITY LIMITATIONS carrying, lifting, bending, standing, and locomotion level  PARTICIPATION LIMITATIONS: community activity and occupation  PERSONAL FACTORS No specific factors noted affecting patient's functional outcome.   REHAB POTENTIAL: Good  CLINICAL DECISION MAKING: Stable/uncomplicated  EVALUATION COMPLEXITY: Low   GOALS: Goals reviewed with patient? Yes  Short term PT Goals (target date for Short term goals are 3 weeks 05/18/2022) Patient will demonstrate independent use of home exercise program to maintain progress from in clinic treatments. Goal status: New   Long term PT goals (target dates for all long term goals are 10 weeks  07/06/2022 )   1. Patient will demonstrate/report pain at worst less than or equal to 2/10 to facilitate minimal limitation in daily activity secondary to pain symptoms. Goal status: New   2. Patient will demonstrate independent use of home exercise program to facilitate ability to maintain/progress functional gains from skilled physical therapy services. Goal status: New   3. Patient will demonstrate FOTO outcome > or = 79 % to indicate reduced disability due to  condition. Goal status: New   4. Patient will demonstrate lumbar extension 100 % WFL s symptoms to facilitate upright standing, walking posture at PLOF s limitation. Goal status: New   5.  Patient will demonstrate lateral flexion to head of fibula s symptoms to facilitate usual mobility at PLOF.    Goal status: New     PLAN: PT FREQUENCY: 1x/week  PT DURATION: up to 10 weeks  PLANNED INTERVENTIONS: Therapeutic exercises, Therapeutic activity, Neuro Muscular re-education, Balance training, Gait training, Patient/Family education, Joint mobilization, Stair training, DME instructions, Dry Needling, Electrical stimulation, Cryotherapy, Moist heat, Taping, Traction Ultrasound, Ionotophoresis 4mg /ml Dexamethasone, and Manual therapy.  All included unless contraindicated   PLAN FOR NEXT SESSION: Review HEP knowledge/results.  Possible dry needling if desired to Rt QL   Scot Jun, PT, DPT, OCS, ATC 04/27/22  3:27 PM   PHYSICAL THERAPY DISCHARGE SUMMARY  Visits from Start of Care: 1  Current functional level related to goals / functional outcomes: See above   Remaining deficits: See above   Education / Equipment: HEP   Patient agrees to discharge. Patient goals were not met. Patient is being discharged due to not returning since the last visit.

## 2022-04-27 NOTE — Addendum Note (Signed)
Addended by: Scot Jun B on: 04/27/2022 03:47 PM   Modules accepted: Orders

## 2022-05-10 ENCOUNTER — Encounter: Payer: BC Managed Care – PPO | Admitting: Rehabilitative and Restorative Service Providers"

## 2022-05-20 ENCOUNTER — Encounter: Payer: BC Managed Care – PPO | Admitting: Rehabilitative and Restorative Service Providers"

## 2022-05-24 ENCOUNTER — Encounter: Payer: BC Managed Care – PPO | Admitting: Rehabilitative and Restorative Service Providers"

## 2022-05-31 ENCOUNTER — Encounter: Payer: BC Managed Care – PPO | Admitting: Family Medicine

## 2022-06-26 ENCOUNTER — Ambulatory Visit
Admission: EM | Admit: 2022-06-26 | Discharge: 2022-06-26 | Disposition: A | Discharge status: Home / Self Care | Payer: BC Managed Care – PPO | Attending: Family Medicine | Admitting: Family Medicine

## 2022-06-26 ENCOUNTER — Encounter: Payer: Self-pay | Admitting: Emergency Medicine

## 2022-06-26 DIAGNOSIS — B370 Candidal stomatitis: Secondary | ICD-10-CM | POA: Diagnosis not present

## 2022-06-26 DIAGNOSIS — Z8742 Personal history of other diseases of the female genital tract: Secondary | ICD-10-CM | POA: Diagnosis not present

## 2022-06-26 MED ORDER — NYSTATIN 100000 UNIT/ML MT SUSP: 500000.0000 [IU] | Freq: Four times a day (QID) | OROMUCOSAL | 0 refills | Status: DC

## 2022-06-26 MED ORDER — FLUCONAZOLE 150 MG PO TABS: 150.0000 mg | ORAL_TABLET | Freq: Every day | ORAL | 0 refills | Status: DC

## 2022-06-26 NOTE — Discharge Instructions (Signed)
Take the Diflucan oral pill.  Repeat in 1 week if needed Use the nystatin oral rinse 4 times a day See your doctor if not improving by next week

## 2022-06-26 NOTE — ED Triage Notes (Signed)
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

## 2022-06-26 NOTE — ED Provider Notes (Signed)
[name]    CSN: [id number] Arrival date & time: 06/26/22  1320      History   Chief Complaint Chief Complaint  Patient presents with   Sore Throat    HPI Phyllis Shelton is a 46 y.o. female.   HPI  Sore throat white bumps on the inside of her mouth for 2 days.  The sides of her tongue are very painful left greater than right. States that she is prone to yeast infections.  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   Anxiety and depression 02/07/2019   Tear of lateral meniscus of left knee 07/25/2018   Chronic pain of left knee 07/07/2018   Adenomyosis 07/31/2017   Menorrhagia with regular cycle 07/31/2017   Vitamin D deficiency 11/18/2016   Physical exam 11/12/2015   Obesity (BMI 30-39.9) 07/25/2013   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 Authorizing Provider  fluconazole (DIFLUCAN) 150 MG tablet Take 1 tablet (150 mg total) by mouth daily. Repeat in 1 week if needed 06/26/22  Yes Raylene Everts, MD  nystatin (MYCOSTATIN) 100000 UNIT/ML suspension Take 5 mLs  (500,000 Units total) by mouth 4 (four) times daily. 06/26/22  Yes Raylene Everts, MD  levonorgestrel [facility]) 20 MCG/24HR IUD 1 each by Intrauterine route once. Placed 09/15/17    [provider]  VITAMIN D PO Take by mouth.    [provider]    Family History Family History  Problem Relation Age of Onset   Osteoporosis Mother        diagnosed at age 1   Cancer Mother        thyroid   Hypertension Father    Aneurysm Father    Alcohol abuse Father    Dementia Maternal Grandmother    Diabetes Paternal Grandmother    Cancer Paternal Grandfather        stomach   Colon cancer Neg Hx    Colon polyps Neg Hx    Esophageal cancer Neg Hx    Stomach cancer Neg Hx    Rectal cancer Neg Hx     Social History Social History   Tobacco Use   Smoking status: Never   Smokeless tobacco: Never  Vaping Use   Vaping Use: Never used  Substance Use Topics   Alcohol use: Yes    Alcohol/week: 1.0 standard drink of alcohol    Types: 1 Standard drinks or equivalent per week   Drug use: No     Allergies   Darvocet [propoxyphene n-acetaminophen] and Percocet [oxycodone-acetaminophen]   Review of Systems Review of Systems See HPI  Physical Exam Triage Vital Signs ED Triage Vitals  Enc Vitals Group     BP 06/26/22 1349 (!) 146/96     Pulse Rate 06/26/22 1349 74     Resp 06/26/22 1349 14     Temp 06/26/22 1349 99.6 F (37.6 C)     Temp Source 06/26/22 1349 Oral     SpO2 06/26/22 1349 99 %     Weight 06/26/22 1352 170 lb (77.1 kg)     Height 06/26/22 1352 '4\' 11"'$  (1.499 m)     Head Circumference --      Peak Flow --      Pain Score 06/26/22 1352 4     Pain Loc --      Pain Edu? --      Excl. in [place]? --    No data found.  Updated Vital Signs BP (!) 146/96 (BP Location: Right Arm)   Pulse 74   Temp 99.6 F (37.6 C) (Oral)   Resp 14   Ht '4\' 11"'$  (1.499 m)   Wt 77.1 kg   SpO2 99%   BMI 34.34 kg/m      Physical Exam Constitutional:      General: She  is not in acute distress.    Appearance: She is well-developed. She is not ill-appearing.  HENT:     Head: Normocephalic and atraumatic.     Right Ear: Tympanic membrane and ear canal normal.     Left Ear: Tympanic membrane and ear canal normal.     Nose: No congestion.     Mouth/Throat:     Mouth: Mucous membranes are moist. Oral lesions present.     Pharynx: No posterior oropharyngeal erythema.     Comments: Went back to along left side of tongue, tender Eyes:     Conjunctiva/sclera: Conjunctivae normal.     Pupils: Pupils are equal, round, and reactive to light.  Cardiovascular:     Rate and Rhythm: Normal rate.  Pulmonary:     Effort: Pulmonary effort is normal. No respiratory distress.  Abdominal:     General: There is no distension.     Palpations: Abdomen is soft.  Musculoskeletal:        General: Normal range of motion.     Cervical back: Normal range of motion.  Lymphadenopathy:     Cervical: Cervical adenopathy present.  Skin:    General: Skin is warm and dry.  Neurological:     Mental Status: She is alert.      UC Treatments / Results  Labs (all labs ordered are listed, but only abnormal results are displayed) Labs Reviewed - No data to display  EKG   Radiology No results found.  Procedures Procedures (including critical care time)  Medications Ordered in UC Medications - No data to display  Initial Impression / Assessment and Plan / UC Course  I have reviewed the triage vital signs and the nursing notes.  Pertinent labs & imaging results that were available during my care of the patient were reviewed by me and considered in my medical decision making (see chart for details).     Final Clinical Impressions(s) / UC Diagnoses  Final diagnoses:  Thrush, oral  History of vaginitis     Discharge Instructions      Take the Diflucan oral pill.  Repeat in 1 week if needed Use the nystatin oral rinse 4 times a day See your doctor if not  improving by next week   ED Prescriptions     Medication Sig Dispense Auth. Provider   fluconazole (DIFLUCAN) 150 MG tablet Take 1 tablet (150 mg total) by mouth daily. Repeat in 1 week if needed 2 tablet Raylene Everts, MD   nystatin (MYCOSTATIN) 100000 UNIT/ML suspension Take 5 mLs (500,000 Units total) by mouth 4 (four) times daily. 120 mL Raylene Everts, MD      PDMP not reviewed this encounter.   Raylene Everts, MD 06/26/22 [phone]

## 2023-01-05 ENCOUNTER — Ambulatory Visit (INDEPENDENT_AMBULATORY_CARE_PROVIDER_SITE_OTHER): Payer: BC Managed Care – PPO | Admitting: Family Medicine

## 2023-01-05 ENCOUNTER — Encounter: Payer: Self-pay | Admitting: Family Medicine

## 2023-01-05 VITALS — BP 124/80 | HR 96 | Temp 98.0°F | Resp 18 | Ht 59.0 in | Wt 183.4 lb

## 2023-01-05 DIAGNOSIS — Z0000 Encounter for general adult medical examination without abnormal findings: Secondary | ICD-10-CM

## 2023-01-05 DIAGNOSIS — E669 Obesity, unspecified: Secondary | ICD-10-CM

## 2023-01-05 DIAGNOSIS — E559 Vitamin D deficiency, unspecified: Secondary | ICD-10-CM

## 2023-01-05 NOTE — Patient Instructions (Signed)
Follow up in 1 year or as needed We'll notify you of your lab results and make any changes if needed Continue to work on healthy diet and regular exercise- you can do it!!! Call and schedule your pap at your convenience Call with any questions or concerns Stay Safe!  Stay Healthy! Have a great summer!!!

## 2023-01-05 NOTE — Assessment & Plan Note (Signed)
Check labs and replete prn. 

## 2023-01-05 NOTE — Assessment & Plan Note (Signed)
Deteriorated.  Pt has gained 8 lbs since last visit.  Encouraged healthy diet and regular exercise.  Check labs to risk stratify.  Will follow.

## 2023-01-05 NOTE — Assessment & Plan Note (Signed)
Pt's PE WNL w/ exception of BMI.  UTD on mammo, colonoscopy, Tdap.  Due for pap- pt to schedule.  Check labs.  Anticipatory guidance provided.

## 2023-01-05 NOTE — Progress Notes (Signed)
   Subjective:    Patient ID: Phyllis Shelton, female    DOB: 1975-07-22, 47 y.o.   MRN: [id number]  HPI CPE- UTD on mammo.  Due for pap.  UTD on Tdap and colonoscopy  Patient Care Team    Relationship Specialty Notifications Start End  Sheliah Hatch, MD PCP - General   07/01/10   Verner Chol, CNM Referring Physician Certified Nurse Midwife  11/12/15     Health Maintenance  Topic Date Due   PAP SMEAR-Modifier  05/22/2022   INFLUENZA VACCINE  02/17/2023   MAMMOGRAM  03/09/2023   DTaP/Tdap/Td (3 - Td or Tdap) 05/27/2030   Colonoscopy  06/02/2031   Hepatitis C Screening  Completed   HIV Screening  Completed   HPV VACCINES  Aged Out   COVID-19 Vaccine  Discontinued      Review of Systems Patient reports no vision/ hearing changes, adenopathy,fever, persistant/recurrent hoarseness , swallowing issues, chest pain, palpitations, edema, persistant/recurrent cough, hemoptysis, dyspnea (rest/exertional/paroxysmal nocturnal), gastrointestinal bleeding (melena, rectal bleeding), abdominal pain, significant heartburn, bowel changes, GU symptoms (dysuria, hematuria, incontinence), Gyn symptoms (abnormal  bleeding, pain),  syncope, focal weakness, memory loss, numbness & tingling, skin/hair/nail changes, abnormal bruising or bleeding, anxiety, or depression.   + 8 lb weight gain    Objective:   Physical Exam General Appearance:    Alert, cooperative, no distress, appears stated age, obese  Head:    Normocephalic, without obvious abnormality, atraumatic  Eyes:    PERRL, conjunctiva/corneas clear, EOM's intact both eyes  Ears:    Normal TM's and external ear canals, both ears  Nose:   Nares normal, septum midline, mucosa normal, no drainage    or sinus tenderness  Throat:   Lips, mucosa, and tongue normal; teeth and gums normal  Neck:   Supple, symmetrical, trachea midline, no adenopathy;    Thyroid: no enlargement/tenderness/nodules  Back:     Symmetric, no curvature, ROM  normal, no CVA tenderness  Lungs:     Clear to auscultation bilaterally, respirations unlabored  Chest Wall:    No tenderness or deformity   Heart:    Regular rate and rhythm, S1 and S2 normal, no murmur, rub   or gallop  Breast Exam:    Deferred to GYN  Abdomen:     Soft, non-tender, bowel sounds active all four quadrants,    no masses, no organomegaly  Genitalia:    Deferred to GYN  Rectal:    Extremities:   Extremities normal, atraumatic, no cyanosis or edema  Pulses:   2+ and symmetric all extremities  Skin:   Skin color, texture, turgor normal, no rashes or lesions  Lymph nodes:   Cervical, supraclavicular, and axillary nodes normal  Neurologic:   CNII-XII intact, normal strength, sensation and reflexes    throughout          Assessment & Plan:

## 2023-01-06 LAB — CBC WITH DIFFERENTIAL/PLATELET
Basophils Absolute: 0.1 10*3/uL (ref 0.0–0.1)
Basophils Relative: 0.9 % (ref 0.0–3.0)
Eosinophils Absolute: 0.2 10*3/uL (ref 0.0–0.7)
Eosinophils Relative: 2.3 % (ref 0.0–5.0)
HCT: 42.7 % (ref 36.0–46.0)
Hemoglobin: 14.7 g/dL (ref 12.0–15.0)
Lymphocytes Relative: 27.4 % (ref 12.0–46.0)
Lymphs Abs: 2.3 10*3/uL (ref 0.7–4.0)
MCHC: 34.4 g/dL (ref 30.0–36.0)
MCV: 88.5 fl (ref 78.0–100.0)
Monocytes Absolute: 0.8 10*3/uL (ref 0.1–1.0)
Monocytes Relative: 9 % (ref 3.0–12.0)
Neutro Abs: 5.1 10*3/uL (ref 1.4–7.7)
Neutrophils Relative %: 60.4 % (ref 43.0–77.0)
Platelets: 249 10*3/uL (ref 150.0–400.0)
RBC: 4.83 Mil/uL (ref 3.87–5.11)
RDW: 13.2 % (ref 11.5–15.5)
WBC: 8.4 10*3/uL (ref 4.0–10.5)

## 2023-01-06 LAB — TSH: TSH: 1.16 u[IU]/mL (ref 0.35–5.50)

## 2023-01-06 LAB — VITAMIN D 25 HYDROXY (VIT D DEFICIENCY, FRACTURES): VITD: 37.27 ng/mL (ref 30.00–100.00)

## 2023-01-06 LAB — LIPID PANEL
Cholesterol: 173 mg/dL (ref 0–200)
HDL: 51.1 mg/dL (ref >39.00)
LDL Cholesterol: 85 mg/dL (ref 0–99)
NonHDL: 121.93
Total CHOL/HDL Ratio: 3
Triglycerides: 186 mg/dL — ABNORMAL HIGH (ref 0.0–149.0)
VLDL: 37.2 mg/dL (ref 0.0–40.0)

## 2023-01-06 LAB — BASIC METABOLIC PANEL
BUN: 14 mg/dL (ref 6–23)
CO2: 29 mEq/L (ref 19–32)
Calcium: 9.6 mg/dL (ref 8.4–10.5)
Chloride: 103 mEq/L (ref 96–112)
Creatinine, Ser: 0.94 mg/dL (ref 0.40–1.20)
GFR: 71.36 mL/min (ref >60.00)
Glucose, Bld: 101 mg/dL — ABNORMAL HIGH (ref 70–99)
Potassium: 3.9 mEq/L (ref 3.5–5.1)
Sodium: 141 mEq/L (ref 135–145)

## 2023-01-06 LAB — HEPATIC FUNCTION PANEL
ALT: 22 U/L (ref 0–35)
AST: 22 U/L (ref 0–37)
Albumin: 4.4 g/dL (ref 3.5–5.2)
Alkaline Phosphatase: 112 U/L (ref 39–117)
Bilirubin, Direct: 0.1 mg/dL (ref 0.0–0.3)
Total Bilirubin: 0.4 mg/dL (ref 0.2–1.2)
Total Protein: 7.9 g/dL (ref 6.0–8.3)

## 2023-01-07 ENCOUNTER — Telehealth: Payer: Self-pay

## 2023-01-07 NOTE — Telephone Encounter (Signed)
Left pt a vm with results  

## 2023-01-07 NOTE — Telephone Encounter (Signed)
-----   Message from Sheliah Hatch, MD sent at 01/07/2023  7:37 AM EDT ----- Labs look great!  No changes at this time

## 2023-02-09 ENCOUNTER — Other Ambulatory Visit: Payer: Self-pay | Admitting: Family Medicine

## 2023-02-09 DIAGNOSIS — Z1231 Encounter for screening mammogram for malignant neoplasm of breast: Secondary | ICD-10-CM

## 2023-02-10 ENCOUNTER — Ambulatory Visit: Payer: BC Managed Care – PPO | Admitting: Family Medicine

## 2023-02-10 VITALS — BP 118/68 | HR 70 | Temp 97.9°F | Wt 188.6 lb

## 2023-02-10 DIAGNOSIS — F32A Depression, unspecified: Secondary | ICD-10-CM | POA: Diagnosis not present

## 2023-02-10 DIAGNOSIS — F419 Anxiety disorder, unspecified: Secondary | ICD-10-CM

## 2023-02-10 MED ORDER — FLUOXETINE HCL 20 MG PO CAPS: 20.0000 mg | ORAL_CAPSULE | Freq: Every day | ORAL | 1 refills | Status: DC

## 2023-02-10 NOTE — Assessment & Plan Note (Signed)
Deteriorated.  Will restart Fluoxetine 20mg  daily as pt has done well on this in the past.  Talked about setting limits and putting systems in place to make her daughter more self sufficient.  Pt agreeable.  Will follow

## 2023-02-10 NOTE — Progress Notes (Signed)
   Subjective:    Patient ID: DAIJANAE RAFALSKI, female    DOB: 1975/10/30, 47 y.o.   MRN: [id number]  HPI Anxiety- ongoing issue for pt.  Daughter is going to college this fall and they are struggling with their relationship.  Currently in therapy and working on this.  Daughter has difficulty taking necessary medication which causes pt to ask repeatedly, which then annoys her for having to ask.  This causes increased irritability and anxiety.  They have not tried using a pill alarm.   Review of Systems For ROS see HPI     Objective:   Physical Exam Vitals reviewed.  Constitutional:      General: She is not in acute distress.    Appearance: Normal appearance. She is not ill-appearing.  HENT:     Head: Normocephalic and atraumatic.  Eyes:     Extraocular Movements: Extraocular movements intact.     Conjunctiva/sclera: Conjunctivae normal.  Neurological:     General: No focal deficit present.     Mental Status: She is alert and oriented to person, place, and time.  Psychiatric:        Mood and Affect: Mood normal.        Behavior: Behavior normal.        Thought Content: Thought content normal.           Assessment & Plan:

## 2023-02-10 NOTE — Patient Instructions (Signed)
Follow up in 3-4 weeks to recheck mood Restart the Fluoxetine daily Encourage her to use pill alarms to become more self sufficient Set an alarm for 30-40 minutes after her alarm and text to remind her Call with any questions or concerns Hang in there!!!

## 2023-02-16 ENCOUNTER — Encounter (INDEPENDENT_AMBULATORY_CARE_PROVIDER_SITE_OTHER): Payer: Self-pay

## 2023-03-03 ENCOUNTER — Ambulatory Visit: Payer: BC Managed Care – PPO | Admitting: Family Medicine

## 2023-03-03 ENCOUNTER — Encounter: Payer: Self-pay | Admitting: Family Medicine

## 2023-03-03 VITALS — BP 128/80 | HR 72 | Temp 97.8°F | Resp 18 | Ht 59.0 in | Wt 185.0 lb

## 2023-03-03 DIAGNOSIS — F419 Anxiety disorder, unspecified: Secondary | ICD-10-CM

## 2023-03-03 DIAGNOSIS — F32A Depression, unspecified: Secondary | ICD-10-CM | POA: Diagnosis not present

## 2023-03-03 NOTE — Assessment & Plan Note (Signed)
Much improved since restarting medication.  She feels things are much better.  No med changes at this time.  Will continue to follow.

## 2023-03-03 NOTE — Progress Notes (Signed)
   Subjective:    Patient ID: Phyllis Shelton, female    DOB: Jan 15, 1976, 47 y.o.   MRN: [id number]  HPI Anxiety/depression- at last visit we restarted Fluoxetine 20mg  daily.  Pt feels that things are much better.  Dropped daughter off at school yesterday for the first time and that went well.  Pt feels 'really ready'.     Review of Systems For ROS see HPI     Objective:   Physical Exam Vitals reviewed.  Constitutional:      General: She is not in acute distress.    Appearance: Normal appearance. She is not ill-appearing.  HENT:     Head: Normocephalic and atraumatic.  Eyes:     Extraocular Movements: Extraocular movements intact.     Conjunctiva/sclera: Conjunctivae normal.  Skin:    General: Skin is warm and dry.  Neurological:     General: No focal deficit present.     Mental Status: She is alert and oriented to person, place, and time.  Psychiatric:        Mood and Affect: Mood normal.        Behavior: Behavior normal.        Thought Content: Thought content normal.           Assessment & Plan:

## 2023-03-03 NOTE — Patient Instructions (Signed)
Follow up as needed or as scheduled No med changes at this time Keep up the good work!!  You're doing great! Call with any questions or concerns ENJOY THE QUIET!!!

## 2023-03-11 ENCOUNTER — Ambulatory Visit
Admission: RE | Admit: 2023-03-11 | Discharge: 2023-03-11 | Disposition: A | Discharge status: Home / Self Care | Payer: BC Managed Care – PPO | Source: Ambulatory Visit

## 2023-03-11 DIAGNOSIS — Z1231 Encounter for screening mammogram for malignant neoplasm of breast: Secondary | ICD-10-CM

## 2023-03-24 ENCOUNTER — Ambulatory Visit: Payer: BC Managed Care – PPO | Admitting: Physician Assistant

## 2023-03-24 VITALS — BP 120/80 | HR 67 | Temp 97.7°F | Ht 59.0 in | Wt 180.4 lb

## 2023-03-24 DIAGNOSIS — H9202 Otalgia, left ear: Secondary | ICD-10-CM

## 2023-03-24 MED ORDER — AMOXICILLIN-POT CLAVULANATE 875-125 MG PO TABS: 1.0000 | ORAL_TABLET | Freq: Two times a day (BID) | ORAL | 0 refills | Status: DC

## 2023-03-24 MED ORDER — PREDNISONE 50 MG PO TABS: ORAL_TABLET | ORAL | 0 refills | Status: DC

## 2023-03-24 NOTE — Patient Instructions (Signed)
It was great to see you!  Start prednisone (hold all NSAIDS such as ibuprofen while taking this)  Start antibiotic(s)  If no improvement or any worsening please let us know  Take care,  Jarold Motto PA-C

## 2023-03-24 NOTE — Progress Notes (Signed)
Phyllis Shelton is a 47 y.o. female here for a follow up of a pre-existing problem.  History of Present Illness:   Chief Complaint  Patient presents with   Otitis Externa    Patient states going to urgent care 2 days ago and they said it may be swimmers ear but it was too swollen for them to see they did give her neomycin but is not helping. Started August 25 she states it seemed to get better then 4 days ago it has gotten worse.     HPI  Otitis Externa (Left): Seen by [facility] on 9/3 for left sided ear pain since 8/25 that had improved before worsening on 9/1.  Has been using prescribed cortisporin drops which she has used with no resolution of symptoms.   Reports that pain has worsened since 9/3 and she can not hear out of left ear due to associated swelling. She has been taking Advil a few times a day to manage symptoms.  Describes experiencing a sore throat first thing in the morning, and does not seem attributed to ear pain.  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 Denies fever, chills, recent dental work, drainage, or long term ear bud usage.   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     Social History   Tobacco Use   Smoking status: Never   Smokeless tobacco: Never  Vaping Use   Vaping status: Never Used  Substance Use Topics   Alcohol use: Yes    Alcohol/week: 1.0 standard drink of alcohol    Types: 1 Standard drinks or equivalent per week   Drug use: No    Past Surgical History:  Procedure Laterality Date   CESAREAN SECTION     INTRAUTERINE DEVICE (IUD) INSERTION     09-15-17 inserted   WISDOM TOOTH EXTRACTION      Family History  Problem Relation Age of Onset   Osteoporosis Mother        diagnosed at age 35   Cancer Mother         thyroid   Hypertension Father    Aneurysm Father    Alcohol abuse Father    Dementia Maternal Grandmother    Diabetes Paternal Grandmother    Cancer Paternal Grandfather        stomach   Colon cancer Neg Hx    Colon polyps Neg Hx    Esophageal cancer Neg Hx    Stomach cancer Neg Hx    Rectal cancer Neg Hx     Allergies  Allergen Reactions   Darvocet [Propoxyphene N-Acetaminophen] Other (See Comments)    Pt states causes her to feel "loopy", hallucinations   Percocet [Oxycodone-Acetaminophen] Other (See Comments)    Pt states causes her to feel "loopy", hallucinations    Current Medications:   Current Outpatient Medications:    FLUoxetine (PROZAC) 20 MG capsule, Take 1 capsule (20 mg total) by mouth daily., Disp: 90 capsule, Rfl: 1   levonorgestrel (MIRENA) 20 MCG/24HR IUD, 1 each by Intrauterine route once. Placed 09/15/17, Disp: , Rfl:    Review of Systems:   Review of Systems  HENT:  Positive for ear pain (left sided), hearing loss (contributed to internal swelling) and sore throat (in the morning; see HPI).  Vitals:   Vitals:   03/24/23 0932  BP: 120/80  Pulse: 67  Temp: 97.7 F (36.5 C)  TempSrc: Temporal  SpO2: 97%  Weight: 180 lb 6.4 oz (81.8 kg)  Height: 4\' 11"  (1.499 m)     Body mass index is 36.44 kg/m.  Physical Exam:   Physical Exam Vitals and nursing note reviewed.  Constitutional:      General: She is not in acute distress.    Appearance: She is well-developed. She is not ill-appearing or toxic-appearing.  HENT:     Head: Normocephalic and atraumatic.     Right Ear: Tympanic membrane, ear canal and external ear normal. No mastoid tenderness. Tympanic membrane is not erythematous, retracted or bulging.     Left Ear: Ear canal and external ear normal. Swelling and tenderness present. No mastoid tenderness.     Nose: Nose normal.     Right Sinus: No maxillary sinus tenderness or frontal sinus tenderness.     Left Sinus: No maxillary sinus  tenderness or frontal sinus tenderness.     Mouth/Throat:     Pharynx: Uvula midline. No posterior oropharyngeal erythema.  Eyes:     General: Lids are normal.     Conjunctiva/sclera: Conjunctivae normal.  Neck:     Trachea: Trachea normal.  Cardiovascular:     Rate and Rhythm: Normal rate and regular rhythm.     Heart sounds: Normal heart sounds, S1 normal and S2 normal.  Pulmonary:     Effort: Pulmonary effort is normal.     Breath sounds: Normal breath sounds. No decreased breath sounds, wheezing, rhonchi or rales.  Lymphadenopathy:     Head:     Left side of head: Preauricular and posterior auricular adenopathy present.     Cervical: Cervical adenopathy present.     Left cervical: Superficial cervical adenopathy present.  Skin:    General: Skin is warm and dry.  Neurological:     Mental Status: She is alert.  Psychiatric:        Speech: Speech normal.        Behavior: Behavior normal. Behavior is cooperative.     Assessment and Plan:   Left ear pain Ear is significant swollen and tender on my exam but no mastoid tenderness; vitals stable Stop cortisporin Start prednisone and Augmentin as prescribed If new/worsening symptom(s) over the weekend -- needs to go to the ER for possible imaging/blood work If lack of resolution needs close follow-up with PCP for possible ENT referral  I,Emily Lagle,acting as a scribe for [employer], PA.,have documented all relevant documentation on the behalf of Jarold Motto, PA,as directed by  Jarold Motto, PA while in the presence of Jarold Motto, [place].  I, Larey Brick, have reviewed all documentation for this visit. The documentation on 03/24/23 for the exam, diagnosis, procedures, and orders are all accurate and complete.  Jarold Motto, PA-C

## 2023-03-30 ENCOUNTER — Other Ambulatory Visit (HOSPITAL_COMMUNITY)
Admission: RE | Admit: 2023-03-30 | Discharge: 2023-03-30 | Disposition: A | Discharge status: Home / Self Care | Payer: BC Managed Care – PPO | Source: Ambulatory Visit | Attending: Obstetrics and Gynecology | Admitting: Obstetrics and Gynecology

## 2023-03-30 ENCOUNTER — Ambulatory Visit (INDEPENDENT_AMBULATORY_CARE_PROVIDER_SITE_OTHER): Payer: BC Managed Care – PPO | Admitting: Obstetrics and Gynecology

## 2023-03-30 ENCOUNTER — Encounter: Payer: Self-pay | Admitting: Obstetrics and Gynecology

## 2023-03-30 VITALS — BP 137/85 | HR 86 | Ht 59.0 in | Wt 183.9 lb

## 2023-03-30 DIAGNOSIS — N760 Acute vaginitis: Secondary | ICD-10-CM

## 2023-03-30 DIAGNOSIS — Z124 Encounter for screening for malignant neoplasm of cervix: Secondary | ICD-10-CM | POA: Insufficient documentation

## 2023-03-30 DIAGNOSIS — B9689 Other specified bacterial agents as the cause of diseases classified elsewhere: Secondary | ICD-10-CM

## 2023-03-30 DIAGNOSIS — Z1339 Encounter for screening examination for other mental health and behavioral disorders: Secondary | ICD-10-CM | POA: Diagnosis not present

## 2023-03-30 DIAGNOSIS — Z01419 Encounter for gynecological examination (general) (routine) without abnormal findings: Secondary | ICD-10-CM

## 2023-03-30 DIAGNOSIS — Z113 Encounter for screening for infections with a predominantly sexual mode of transmission: Secondary | ICD-10-CM | POA: Insufficient documentation

## 2023-03-30 NOTE — Progress Notes (Signed)
ANNUAL EXAM Patient name: Phyllis Shelton MRN [id number]  Date of birth: 01/08/1976 Chief Complaint:   Annual Exam  History of Present Illness:   Phyllis Shelton is a 47 y.o. [phone]  female being seen today for a routine annual exam.  Current complaints: Has an Mirena in place, doing well. Up to date on mammogram and colonscopy.No gyn complaints  No LMP recorded. (Menstrual status: IUD). Placed in 2019   The pregnancy intention screening data noted above was reviewed. Potential methods of contraception were discussed. The patient elected to proceed with No data recorded.   Last pap 05/23/23. Results were: NILM w/ HRHPV not done. H/O abnormal pap: no Last mammogram: 03/15/23. Results were: normal. Family h/o breast cancer: no Last colonoscopy: 06/01/21. Results were: normal. Family h/o colorectal cancer: no/     03/30/2023    3:12 PM 03/24/2023    9:45 AM 03/03/2023    1:12 PM 02/10/2023    1:50 PM 01/05/2023    2:20 PM  Depression screen PHQ 2/9  Decreased Interest 0 0 0 2 1  Down, Depressed, Hopeless 0 0 0 2 1  PHQ - 2 Score 0 0 0 4 2  Altered sleeping 0 0 0 2 0  Tired, decreased energy 0 0 0 3 2  Change in appetite 0 0 0 1 0  Feeling bad or failure about yourself  0 0 0 2 1  Trouble concentrating 0 0 0 0 0  Moving slowly or fidgety/restless 0 0 0 0 0  Suicidal thoughts 0 0 0 1 0  PHQ-9 Score 0 0 0 13 5  Difficult doing work/chores  Not difficult at all Not difficult at all Somewhat difficult Not difficult at all        03/30/2023    3:12 PM 03/03/2023    1:13 PM 02/10/2023    1:51 PM 01/05/2023    2:21 PM  GAD 7 : Generalized Anxiety Score  Nervous, Anxious, on Edge 0 0 3 1  Control/stop worrying 0 0 3 2  Worry too much - different things 0 0 3 1  Trouble relaxing 0 0 3 0  Restless 0 0 0 0  Easily annoyed or irritable 0 0 1 0  Afraid - awful might happen 0 0 3 1  Total GAD 7 Score 0 0 16 5  Anxiety Difficulty  Not difficult at all Somewhat difficult Not difficult at  all     Review of Systems:   Pertinent items are noted in HPI Denies any headaches, blurred vision, fatigue, shortness of breath, chest pain, abdominal pain, abnormal vaginal discharge/itching/odor/irritation, problems with periods, bowel movements, urination, or intercourse unless otherwise stated above. Pertinent History Reviewed:  Reviewed past medical,surgical, social and family history.  Reviewed problem list, medications and allergies. Physical Assessment:   Vitals:   03/30/23 1505  BP: 137/85  Pulse: 86  Weight: 183 lb 14.4 oz (83.4 kg)  Height: 4\' 11"  (1.499 m)  Body mass index is 37.14 kg/m.        Physical Examination:   General appearance - well appearing, and in no distress  Mental status - alert, oriented to person, place, and time  Psych:  She has a normal mood and affect  Skin - warm and dry, normal color, no suspicious lesions noted  Chest - effort normal, all lung fields clear to auscultation bilaterally  Heart - normal rate and regular rhythm  Neck:  midline trachea, no thyromegaly  Breasts - deferred  Abdomen - soft, nontender, nondistended, no masses or organomegaly  Pelvic - VULVA: normal appearing vulva with no masses, tenderness or lesions  VAGINA: normal appearing vagina with normal color and discharge, no lesions  CERVIX: normal appearing cervix without discharge or lesions, no CMT  Thin prep pap is done with HR HPV cotesting  Extremities:  No swelling or varicosities noted  Chaperone present for exam  No results found for this or any previous visit (from the past 24 hour(s)).  Assessment & Plan:   1. Well woman exam Discussed Mirena IUD good until 08/2025  2. Cervical cancer screening  - Cytology - PAP( [place])  3. Screen for STD (sexually transmitted disease)  - Cervicovaginal ancillary only( [place])  Mammogram: in 1 year, or sooner if problems Colonoscopy: per GI, or sooner if problems  No orders of the defined types were  placed in this encounter.   Meds: No orders of the defined types were placed in this encounter.   Follow-up: Return in about 1 year (around 03/29/2024) for Thayer Jew, FNP

## 2023-04-01 LAB — CERVICOVAGINAL ANCILLARY ONLY
Bacterial Vaginitis (gardnerella): POSITIVE — AB
Candida Glabrata: NEGATIVE
Candida Vaginitis: NEGATIVE
Chlamydia: NEGATIVE
Comment: NEGATIVE
Comment: NEGATIVE
Comment: NEGATIVE
Comment: NEGATIVE
Comment: NEGATIVE
Comment: NORMAL
Neisseria Gonorrhea: NEGATIVE
Trichomonas: NEGATIVE

## 2023-04-04 MED ORDER — METRONIDAZOLE 0.75 % VA GEL: 1.0000 | Freq: Every day | VAGINAL | 1 refills | Status: DC

## 2023-04-04 NOTE — Addendum Note (Signed)
Addended by: Sue Lush on: 04/04/2023 12:26 PM   Modules accepted: Orders

## 2023-04-05 LAB — CYTOLOGY - PAP
Comment: NEGATIVE
Comment: NEGATIVE
Comment: NEGATIVE
HPV 16: NEGATIVE
HPV 18 / 45: NEGATIVE
High risk HPV: POSITIVE — AB

## 2023-04-07 ENCOUNTER — Encounter: Payer: Self-pay | Admitting: Obstetrics and Gynecology

## 2023-04-07 DIAGNOSIS — R87612 Low grade squamous intraepithelial lesion on cytologic smear of cervix (LGSIL): Secondary | ICD-10-CM | POA: Insufficient documentation

## 2023-04-19 ENCOUNTER — Encounter: Payer: Self-pay | Admitting: Physician Assistant

## 2023-04-19 ENCOUNTER — Ambulatory Visit: Payer: BC Managed Care – PPO | Admitting: Physician Assistant

## 2023-04-19 VITALS — BP 124/80 | HR 54 | Temp 97.3°F | Ht 59.0 in | Wt 183.0 lb

## 2023-04-19 DIAGNOSIS — H9202 Otalgia, left ear: Secondary | ICD-10-CM | POA: Diagnosis not present

## 2023-04-19 MED ORDER — AZELASTINE HCL 0.1 % NA SOLN: 2.0000 | Freq: Two times a day (BID) | NASAL | 1 refills | Status: DC

## 2023-04-19 NOTE — Patient Instructions (Addendum)
It was great to see you!  Start regular use of over the counter antihistamines such as Zyrtec (cetirizine), Claritin (loratadine), Allegra (fexofenadine), or Xyzal (levocetirizine) daily as needed.  Start Nasal saline spray (i.e., Simply Saline) or nasal saline lavage (i.e., NeilMed) is recommended as needed and prior to medicated nasal sprays. Start astelin nasal spray  We will place ENT referral just in case this does not work

## 2023-04-19 NOTE — Progress Notes (Signed)
Phyllis Shelton is a 47 y.o. female here for a follow up of a pre-existing problem.  History of Present Illness:   Chief Complaint  Patient presents with   Otalgia    Pt c/o left ear pain, feels bruised.    Ear Pain  She was treated with antibiotics and had her ears cleaned on 9/12 urgent care visit. She currently feels as if her ear is bruised on the inside.  Her pain level is currently at 3-4. She was cleaning it with Hydrogen peroxide twice a week since her urgent care visit but states that she never felt completley cleaned.  She is taking ibuprofen as needed. She is also experiencing congestion and sneezing but attributes that to the weather. Denies any nasal drainage.  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     Social History   Tobacco Use   Smoking status: Never   Smokeless tobacco: Never  Vaping Use   Vaping status: Never Used  Substance Use Topics   Alcohol use: Yes    Alcohol/week: 1.0 standard drink of alcohol    Types: 1 Standard drinks or equivalent per week   Drug use: No    Past Surgical History:  Procedure Laterality Date   CESAREAN SECTION     INTRAUTERINE DEVICE (IUD) INSERTION     09-15-17 inserted   WISDOM TOOTH EXTRACTION      Family History  Problem Relation Age of Onset   Osteoporosis Mother        diagnosed at age 53   Cancer Mother        thyroid   Hypertension Father    Aneurysm Father    Alcohol abuse Father    Dementia Maternal Grandmother    Diabetes Paternal Grandmother    Cancer Paternal Grandfather        stomach   Colon cancer Neg Hx    Colon polyps Neg Hx    Esophageal cancer Neg Hx    Stomach cancer Neg Hx    Rectal cancer Neg Hx     Allergies  Allergen Reactions   Darvocet [Propoxyphene N-Acetaminophen] Other (See Comments)    Pt states  causes her to feel "loopy", hallucinations   Percocet [Oxycodone-Acetaminophen] Other (See Comments)    Pt states causes her to feel "loopy", hallucinations    Current Medications:   Current Outpatient Medications:    azelastine (ASTELIN) 0.1 % nasal spray, Place 2 sprays into both nostrils 2 (two) times daily. Use in each nostril as directed, Disp: 30 mL, Rfl: 1   FLUoxetine (PROZAC) 20 MG capsule, Take 1 capsule (20 mg total) by mouth daily., Disp: 90 capsule, Rfl: 1   levonorgestrel (MIRENA) 20 MCG/24HR IUD, 1 each by Intrauterine route once. Placed 09/15/17, Disp: , Rfl:    Review of Systems:   Review of Systems  HENT:  Positive for congestion and ear pain.     Vitals:   Vitals:   04/19/23 0802  BP: 124/80  Pulse: (!) 54  Temp: (!) 97.3 F (36.3 C)  TempSrc: Temporal  SpO2: 97%  Weight: 183 lb (83 kg)  Height: 4\' 11"  (1.499 m)     Body mass index is 36.96 kg/m.  Physical Exam:   Physical Exam Vitals and nursing note reviewed.  Constitutional:      General:  She is not in acute distress.    Appearance: She is well-developed. She is not ill-appearing or toxic-appearing.  HENT:     Head: Normocephalic and atraumatic.     Right Ear: Ear canal and external ear normal. No tenderness. A middle ear effusion is present. Tympanic membrane is not erythematous, retracted or bulging.     Left Ear: Ear canal and external ear normal. No tenderness. A middle ear effusion is present. Tympanic membrane is not erythematous, retracted or bulging.     Ears:     Comments: Left tympanic membrane dull     Nose: Nose normal.     Right Sinus: No maxillary sinus tenderness or frontal sinus tenderness.     Left Sinus: No maxillary sinus tenderness or frontal sinus tenderness.     Mouth/Throat:     Pharynx: Uvula midline. No posterior oropharyngeal erythema.  Eyes:     General: Lids are normal.     Conjunctiva/sclera: Conjunctivae normal.  Neck:     Trachea: Trachea normal.   Cardiovascular:     Rate and Rhythm: Normal rate and regular rhythm.     Heart sounds: Normal heart sounds, S1 normal and S2 normal.  Pulmonary:     Effort: Pulmonary effort is normal.     Breath sounds: Normal breath sounds. No decreased breath sounds, wheezing, rhonchi or rales.  Lymphadenopathy:     Cervical: No cervical adenopathy.  Skin:    General: Skin is warm and dry.  Neurological:     Mental Status: She is alert.  Psychiatric:        Speech: Speech normal.        Behavior: Behavior normal. Behavior is cooperative.     Assessment and Plan:   Left ear pain No red flags on exam.   Suspect eustachian tube dysfunction Recommendations as follows: Start regular use of over the counter antihistamines such as Zyrtec (cetirizine), Claritin (loratadine), Allegra (fexofenadine), or Xyzal (levocetirizine) daily as needed.  Start Nasal saline spray (i.e., Simply Saline) or nasal saline lavage (i.e., NeilMed) is recommended as needed and prior to medicated nasal sprays. Start astelin nasal spray  Placed ENT referral as well   Jarold Motto, PA-C  I,Safa M Kadhim,acting as a scribe for Jarold Motto, PA.,have documented all relevant documentation on the behalf of Jarold Motto, PA,as directed by  Jarold Motto, PA while in the presence of Jarold Motto, [place].   I, Jarold Motto, [place], have reviewed all documentation for this visit. The documentation on 04/19/23 for the exam, diagnosis, procedures, and orders are all accurate and complete.

## 2023-05-06 ENCOUNTER — Ambulatory Visit (INDEPENDENT_AMBULATORY_CARE_PROVIDER_SITE_OTHER): Payer: BC Managed Care – PPO | Admitting: Family Medicine

## 2023-05-06 ENCOUNTER — Encounter: Payer: Self-pay | Admitting: Family Medicine

## 2023-05-06 VITALS — BP 138/74 | HR 65 | Temp 97.7°F | Ht 59.0 in | Wt 182.0 lb

## 2023-05-06 DIAGNOSIS — R41840 Attention and concentration deficit: Secondary | ICD-10-CM

## 2023-05-06 NOTE — Progress Notes (Unsigned)
   Subjective:    Patient ID: Phyllis Shelton, female    DOB: 1976/01/14, 47 y.o.   MRN: [id number]  HPI ? ADHD- sister was recently dx'd and while they were talking, most of what sister is saying seemingly applies to pt's situation as well.  Pt has no sxs of hyperactivity but does struggle w/ inattentiveness.  Really has a hard time with organization despite reading books and buying tools to help her with this.     Review of Systems For ROS see HPI     Objective:   Physical Exam Vitals reviewed.  Constitutional:      General: She is not in acute distress.    Appearance: Normal appearance. She is not ill-appearing.  HENT:     Head: Normocephalic and atraumatic.  Skin:    General: Skin is warm and dry.  Neurological:     General: No focal deficit present.     Mental Status: She is alert and oriented to person, place, and time.  Psychiatric:        Mood and Affect: Mood normal.        Behavior: Behavior normal.        Thought Content: Thought content normal.           Assessment & Plan:  Attention/Concentration deficit- new.  Pt reports that after talking w/ family members who have recently been diagnosed, she realized that she has many of the same sxs and has for years.  She is interested in being formally evaluated.  Will refer to Dr Clayborn Heron and his ADHD practice.  Pt expressed understanding and is in agreement w/ plan.

## 2023-05-06 NOTE — Patient Instructions (Signed)
Follow up as needed or as scheduled We'll let you know about your ADHD referral once I hear back from Dr Roseanne Reno Call with any questions or concerns Hang in there!!!

## 2023-05-11 ENCOUNTER — Encounter (INDEPENDENT_AMBULATORY_CARE_PROVIDER_SITE_OTHER): Payer: Self-pay | Admitting: Otolaryngology

## 2023-05-24 ENCOUNTER — Other Ambulatory Visit (HOSPITAL_COMMUNITY)
Admission: RE | Admit: 2023-05-24 | Discharge: 2023-05-24 | Disposition: A | Discharge status: Home / Self Care | Payer: BC Managed Care – PPO | Source: Ambulatory Visit | Attending: Obstetrics and Gynecology | Admitting: Obstetrics and Gynecology

## 2023-05-24 ENCOUNTER — Ambulatory Visit: Payer: BC Managed Care – PPO | Admitting: Obstetrics and Gynecology

## 2023-05-24 ENCOUNTER — Encounter: Payer: Self-pay | Admitting: Obstetrics and Gynecology

## 2023-05-24 VITALS — BP 113/73 | HR 81 | Ht 59.0 in | Wt 182.0 lb

## 2023-05-24 DIAGNOSIS — R87810 Cervical high risk human papillomavirus (HPV) DNA test positive: Secondary | ICD-10-CM | POA: Diagnosis not present

## 2023-05-24 DIAGNOSIS — Z3202 Encounter for pregnancy test, result negative: Secondary | ICD-10-CM | POA: Diagnosis not present

## 2023-05-24 DIAGNOSIS — R87612 Low grade squamous intraepithelial lesion on cytologic smear of cervix (LGSIL): Secondary | ICD-10-CM

## 2023-05-24 DIAGNOSIS — T8332XA Displacement of intrauterine contraceptive device, initial encounter: Secondary | ICD-10-CM

## 2023-05-24 DIAGNOSIS — Z23 Encounter for immunization: Secondary | ICD-10-CM | POA: Diagnosis not present

## 2023-05-24 LAB — POCT URINE PREGNANCY: Preg Test, Ur: NEGATIVE

## 2023-05-24 NOTE — Progress Notes (Signed)
47 y.o. GYN presents for COLPO.  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  UPT  Negative

## 2023-05-24 NOTE — Addendum Note (Signed)
Addended by: Maretta Bees on: 05/24/2023 02:48 PM   Modules accepted: Orders

## 2023-05-24 NOTE — Progress Notes (Signed)
47 yo with abnormal pap smear in September here for scheduled colposcopy. Patient is without complaints. 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   Past Surgical History:  Procedure Laterality Date   CESAREAN SECTION     INTRAUTERINE DEVICE (IUD) INSERTION     09-15-17 inserted   WISDOM TOOTH EXTRACTION     Family History  Problem Relation Age of Onset   Osteoporosis Mother        diagnosed at age 5   Cancer Mother        thyroid   Hypertension Father    Aneurysm Father    Alcohol abuse Father    ADD / ADHD Sister    ADD / ADHD Daughter    Dementia Maternal Grandmother    Diabetes Paternal Grandmother    Cancer Paternal Grandfather        stomach   Colon cancer Neg Hx    Colon polyps Neg Hx    Esophageal cancer Neg Hx    Stomach cancer Neg Hx    Rectal cancer Neg Hx    Social History   Tobacco Use   Smoking status: Never   Smokeless tobacco: Never  Vaping Use   Vaping status: Never Used  Substance Use Topics   Alcohol use: Yes    Alcohol/week: 1.0 standard drink of alcohol    Types: 1 Standard drinks or equivalent per week   Drug use: No    ROS  See pertinent in HPI. All other systems reviewed and non contributory Blood pressure 113/73, pulse 81, height 4\' 11"  (1.499 m), weight 182 lb (82.6 kg).  GENERAL: Well-developed, well-nourished female in no acute distress.  PELVIC: Normal external female genitalia. Vagina is pink and rugated.  Normal discharge. Normal appearing cervix with IUD strings and main stem visualized at the os. Chaperone present during the pelvic exam EXTREMITIES: No cyanosis, clubbing, or edema, 2+ distal pulses.  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  Placed in lithotomy position. Cervix viewed with speculum and colposcope after application of acetic acid.   Colposcopy adequate?  yes Acetowhite lesions?yes 6 o'clock Punctation?no Mosaicism?  no Abnormal vasculature?  no Biopsies?yes 6 o'clock ECC?yes  COMMENTS: Patient was given post procedure instructions.  She will be contacted with results and management plan Patient informed of malpositioned IUD in the endocervical canal and recommendation for removal and replacement due to decrease effectiveness for contraception. She reports amenorrhea with IUD and plans to return for its replacement at a later date  Catalina Antigua, MD

## 2023-05-26 LAB — SURGICAL PATHOLOGY

## 2023-06-02 ENCOUNTER — Ambulatory Visit (INDEPENDENT_AMBULATORY_CARE_PROVIDER_SITE_OTHER): Payer: BC Managed Care – PPO | Admitting: Otolaryngology

## 2023-06-02 ENCOUNTER — Encounter (INDEPENDENT_AMBULATORY_CARE_PROVIDER_SITE_OTHER): Payer: Self-pay

## 2023-06-02 VITALS — Ht 59.0 in | Wt 180.0 lb

## 2023-06-02 DIAGNOSIS — H9202 Otalgia, left ear: Secondary | ICD-10-CM | POA: Diagnosis not present

## 2023-06-05 DIAGNOSIS — H9202 Otalgia, left ear: Secondary | ICD-10-CM | POA: Insufficient documentation

## 2023-06-05 NOTE — Progress Notes (Signed)
Patient ID: Phyllis Shelton, female   DOB: 02/04/1976, 47 y.o.   MRN: [id number]  CC: Left ear pain  HPI:  Phyllis Shelton is a 47 y.o. female who presents today complaining of intermittent left ear pain for the past 2 months.  She has noted occasional swelling of the left ear, requiring treatment with antibiotic eardrops and prednisone.  She is also on Flonase to treat possible eustachian tube dysfunction.  The patient has no previous ENT surgery.  She has no previous known otitis media or otitis externa.  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    Past Surgical History:  Procedure Laterality Date   CESAREAN SECTION     INTRAUTERINE DEVICE (IUD) INSERTION     09-15-17 inserted   WISDOM TOOTH EXTRACTION      Family History  Problem Relation Age of Onset   Osteoporosis Mother        diagnosed at age 22   Cancer Mother        thyroid   Hypertension Father    Aneurysm Father    Alcohol abuse Father    ADD / ADHD Sister    ADD / ADHD Daughter    Dementia Maternal Grandmother    Diabetes Paternal Grandmother    Cancer Paternal Grandfather        stomach   Colon cancer Neg Hx    Colon polyps Neg Hx    Esophageal cancer Neg Hx    Stomach cancer Neg Hx    Rectal cancer Neg Hx     Social History:  reports that she has never smoked. She has never used smokeless tobacco. She reports current alcohol use of about 1.0 standard drink of alcohol per week. She reports that she does not use drugs.  Allergies:  Allergies  Allergen Reactions   Darvocet [Propoxyphene N-Acetaminophen] Other (See Comments)    Pt states causes her to feel "loopy", hallucinations   Percocet [Oxycodone-Acetaminophen] Other (See Comments)    Pt states causes her to feel "loopy", hallucinations    Prior to Admission medications    Medication Sig Start Date End Date Taking? Authorizing Provider  FLUoxetine (PROZAC) 20 MG capsule Take 1 capsule (20 mg total) by mouth daily. 02/10/23  Yes Sheliah Hatch, MD  levonorgestrel (MIRENA) 20 MCG/24HR IUD 1 each by Intrauterine route once. Placed 09/15/17   Yes [provider]  azelastine (ASTELIN) 0.1 % nasal spray Place 2 sprays into both nostrils 2 (two) times daily. Use in each nostril as directed 04/19/23   Jarold Motto, PA    Height 4\' 11"  (1.499 m), weight 180 lb (81.6 kg). Exam: General: Communicates without difficulty, well nourished, no acute distress. Head: Normocephalic, no evidence injury, no tenderness, facial buttresses intact without stepoff. Face/sinus: No tenderness to palpation and percussion. Facial movement is normal and symmetric. Eyes: PERRL, EOMI. No scleral icterus, conjunctivae clear. Neuro: CN II exam reveals vision grossly intact.  No nystagmus at any point of gaze. Ears: Auricles well formed without lesions.  Ear canals are intact without mass or lesion.  No erythema or edema is appreciated.  The TMs are intact without fluid. Nose: External evaluation reveals normal support and skin without lesions.  Dorsum is intact.  Anterior rhinoscopy reveals congested mucosa over anterior aspect of inferior turbinates and intact septum.  No purulence noted. Oral:  Oral cavity and oropharynx are intact, symmetric, without erythema or edema.  Mucosa is moist without lesions. Neck: Full range of motion without pain.  There is no significant lymphadenopathy.  No masses palpable.  Thyroid bed within normal limits to palpation.  Parotid glands and submandibular glands equal bilaterally without mass.  Trachea is midline. Neuro:  CN 2-12 grossly intact.   Assessment: 1.  Referred left otalgia.  This may be secondary to musculoskeletal causes. 2.  Her ear canals, tympanic membranes, and middle ear spaces are all normal.  No middle ear effusion or infection is  noted.  Plan: 1.  The physical exam findings are reviewed with the patient. 2.  She is reassured that no otologic abnormality or infection is noted today. 3.  NSAID as needed to treat her referred otalgia. 4.  The patient is instructed to return for reevaluation if her left ear pain recurs.  Mayson Sterbenz W Odelia Graciano 06/05/2023, 8:36 PM

## 2023-06-13 ENCOUNTER — Encounter: Payer: Self-pay | Admitting: Family Medicine

## 2023-06-13 DIAGNOSIS — R41840 Attention and concentration deficit: Secondary | ICD-10-CM

## 2023-06-14 NOTE — Addendum Note (Signed)
Addended by: Sheliah Hatch on: 06/14/2023 08:31 AM   Modules accepted: Orders

## 2023-07-25 ENCOUNTER — Ambulatory Visit: Payer: 59 | Admitting: *Deleted

## 2023-07-25 VITALS — BP 138/91 | HR 89 | Wt 182.5 lb

## 2023-07-25 DIAGNOSIS — R87810 Cervical high risk human papillomavirus (HPV) DNA test positive: Secondary | ICD-10-CM

## 2023-07-25 DIAGNOSIS — Z23 Encounter for immunization: Secondary | ICD-10-CM

## 2023-07-25 NOTE — Progress Notes (Signed)
 Phyllis Shelton presents for Gardasil #2 injection. First dose given 05/24/23. Pt tolerated injection well. Number 3 dose scheduled for May 2025.

## 2023-08-17 ENCOUNTER — Other Ambulatory Visit: Payer: Self-pay

## 2023-08-17 MED ORDER — FLUOXETINE HCL 20 MG PO CAPS: 20.0000 mg | ORAL_CAPSULE | Freq: Every day | ORAL | 1 refills | Status: AC

## 2023-10-25 ENCOUNTER — Other Ambulatory Visit (HOSPITAL_COMMUNITY): Payer: Self-pay

## 2023-10-25 MED ORDER — LISDEXAMFETAMINE DIMESYLATE 20 MG PO CAPS
20.0000 mg | ORAL_CAPSULE | Freq: Every day | ORAL | 0 refills | Status: DC
  Filled 2023-10-25: qty 30, 30d supply, fill #0

## 2023-11-21 ENCOUNTER — Ambulatory Visit: Payer: BC Managed Care – PPO

## 2023-11-21 DIAGNOSIS — Z23 Encounter for immunization: Secondary | ICD-10-CM

## 2023-11-21 NOTE — Progress Notes (Signed)
 Phyllis Shelton is here for their 3rd Gardasil injection. Pt denies any issues at this time. Pt tolerated injection well. Completed vaccine series.

## 2023-11-23 ENCOUNTER — Other Ambulatory Visit (HOSPITAL_COMMUNITY): Payer: Self-pay

## 2023-11-23 MED ORDER — LISDEXAMFETAMINE DIMESYLATE 20 MG PO CAPS
20.0000 mg | ORAL_CAPSULE | Freq: Every day | ORAL | 0 refills | Status: DC
  Filled 2023-11-23: qty 30, 30d supply, fill #0

## 2023-11-26 ENCOUNTER — Encounter

## 2023-12-28 ENCOUNTER — Other Ambulatory Visit (HOSPITAL_COMMUNITY): Payer: Self-pay

## 2023-12-28 MED ORDER — LISDEXAMFETAMINE DIMESYLATE 20 MG PO CAPS
20.0000 mg | ORAL_CAPSULE | Freq: Every day | ORAL | 0 refills | Status: DC
  Filled 2023-12-28: qty 30, 30d supply, fill #0

## 2023-12-29 ENCOUNTER — Other Ambulatory Visit (HOSPITAL_COMMUNITY): Payer: Self-pay

## 2024-01-06 ENCOUNTER — Ambulatory Visit (INDEPENDENT_AMBULATORY_CARE_PROVIDER_SITE_OTHER): Payer: BC Managed Care – PPO | Admitting: Family Medicine

## 2024-01-06 ENCOUNTER — Encounter: Payer: Self-pay | Admitting: Family Medicine

## 2024-01-06 VITALS — BP 112/68 | HR 84 | Resp 16 | Ht 59.0 in | Wt 174.6 lb

## 2024-01-06 DIAGNOSIS — E669 Obesity, unspecified: Secondary | ICD-10-CM

## 2024-01-06 DIAGNOSIS — Z0000 Encounter for general adult medical examination without abnormal findings: Secondary | ICD-10-CM | POA: Diagnosis not present

## 2024-01-06 DIAGNOSIS — E559 Vitamin D deficiency, unspecified: Secondary | ICD-10-CM | POA: Diagnosis not present

## 2024-01-06 NOTE — Progress Notes (Signed)
   Subjective:    Patient ID: Phyllis Shelton, female    DOB: 1975/07/30, 48 y.o.   MRN: [id number]  HPI CPE- UTD on mammo, pap, Tdap, colonoscopy  Patient Care Team    Relationship Specialty Notifications Start End  Jess Morita, MD PCP - General   07/01/10   Aleen Ammons, CNM Referring Physician Certified Nurse Midwife  11/12/15     Health Maintenance  Topic Date Due   Zoster Vaccines- Shingrix (1 of 2) 04/07/2024 (Originally 09/21/2023)   INFLUENZA VACCINE  02/17/2024   MAMMOGRAM  03/10/2024   Cervical Cancer Screening (HPV/Pap Cotest)  03/29/2028   DTaP/Tdap/Td (3 - Td or Tdap) 05/27/2030   Colonoscopy  06/02/2031   Hepatitis C Screening  Completed   HIV Screening  Completed   HPV VACCINES  Aged Out   Meningococcal B Vaccine  Aged Out   COVID-19 Vaccine  Discontinued      Review of Systems Patient reports no vision/ hearing changes, adenopathy,fever, persistant/recurrent hoarseness , swallowing issues, chest pain, palpitations, edema, persistant/recurrent cough, hemoptysis, dyspnea (rest/exertional/paroxysmal nocturnal), gastrointestinal bleeding (melena, rectal bleeding), abdominal pain, significant heartburn, bowel changes, GU symptoms (dysuria, hematuria, incontinence), Gyn symptoms (abnormal  bleeding, pain),  syncope, focal weakness, memory loss, numbness & tingling, skin/hair/nail changes, abnormal bruising or bleeding, anxiety, or depression.   + 10 lb weight loss    Objective:   Physical Exam General Appearance:    Alert, cooperative, no distress, appears stated age  Head:    Normocephalic, without obvious abnormality, atraumatic  Eyes:    PERRL, conjunctiva/corneas clear, EOM's intact both eyes  Ears:    Normal TM's and external ear canals, both ears  Nose:   Nares normal, septum midline, mucosa normal, no drainage    or sinus tenderness  Throat:   Lips, mucosa, and tongue normal; teeth and gums normal  Neck:   Supple, symmetrical, trachea midline, no  adenopathy;    Thyroid : no enlargement/tenderness/nodules  Back:     Symmetric, no curvature, ROM normal, no CVA tenderness  Lungs:     Clear to auscultation bilaterally, respirations unlabored  Chest Wall:    No tenderness or deformity   Heart:    Regular rate and rhythm, S1 and S2 normal, no murmur, rub   or gallop  Breast Exam:    Deferred to GYN  Abdomen:     Soft, non-tender, bowel sounds active all four quadrants,    no masses, no organomegaly  Genitalia:    Deferred to GYN  Rectal:    Extremities:   Extremities normal, atraumatic, no cyanosis or edema  Pulses:   2+ and symmetric all extremities  Skin:   Skin color, texture, turgor normal, no rashes or lesions  Lymph nodes:   Cervical, supraclavicular, and axillary nodes normal  Neurologic:   CNII-XII intact, normal strength, sensation and reflexes    throughout          Assessment & Plan:

## 2024-01-06 NOTE — Assessment & Plan Note (Signed)
 Pt's PE WNL w/ exception of BMI.  UTD on pap, mammo, colonoscopy, tdap.  Applauded her weight loss efforts.  Check labs to risk stratify.  Anticipatory guidance provided.

## 2024-01-06 NOTE — Patient Instructions (Signed)
Follow up in 1 year or as needed We'll notify you of your lab results and make any changes if needed Continue to work on healthy diet and regular exercise- you're doing great! Call with any questions or concerns Stay Safe!  Stay Healthy! Have a great summer!!! 

## 2024-01-07 LAB — CBC WITH DIFFERENTIAL/PLATELET
Absolute Lymphocytes: 2247 {cells}/uL (ref 850–3900)
Absolute Monocytes: 725 {cells}/uL (ref 200–950)
Basophils Absolute: 105 {cells}/uL (ref 0–200)
Basophils Relative: 1 %
Eosinophils Absolute: 242 {cells}/uL (ref 15–500)
Eosinophils Relative: 2.3 %
HCT: 47.1 % — ABNORMAL HIGH (ref 35.0–45.0)
Hemoglobin: 15.8 g/dL — ABNORMAL HIGH (ref 11.7–15.5)
MCH: 30 pg (ref 27.0–33.0)
MCHC: 33.5 g/dL (ref 32.0–36.0)
MCV: 89.4 fL (ref 80.0–100.0)
MPV: 11.6 fL (ref 7.5–12.5)
Monocytes Relative: 6.9 %
Neutro Abs: 7182 {cells}/uL (ref 1500–7800)
Neutrophils Relative %: 68.4 %
Platelets: 226 10*3/uL (ref 140–400)
RBC: 5.27 10*6/uL — ABNORMAL HIGH (ref 3.80–5.10)
RDW: 12.3 % (ref 11.0–15.0)
Total Lymphocyte: 21.4 %
WBC: 10.5 10*3/uL (ref 3.8–10.8)

## 2024-01-07 LAB — TSH: TSH: 1.28 m[IU]/L

## 2024-01-07 LAB — BASIC METABOLIC PANEL WITH GFR
BUN: 14 mg/dL (ref 7–25)
CO2: 22 mmol/L (ref 20–32)
Calcium: 10.1 mg/dL (ref 8.6–10.4)
Chloride: 102 mmol/L (ref 98–110)
Creat: 0.86 mg/dL (ref 0.50–1.03)
Glucose, Bld: 76 mg/dL (ref 65–99)
Potassium: 4.3 mmol/L (ref 3.5–5.3)
Sodium: 139 mmol/L (ref 135–146)
eGFR: 82 mL/min/{1.73_m2} (ref >=60)

## 2024-01-07 LAB — HEPATIC FUNCTION PANEL
AG Ratio: 1.5 (calc) (ref 1.0–2.5)
ALT: 17 U/L (ref 6–29)
AST: 18 U/L (ref 10–35)
Albumin: 4.7 g/dL (ref 3.6–5.1)
Alkaline phosphatase (APISO): 118 U/L (ref 37–153)
Bilirubin, Direct: 0.1 mg/dL (ref 0.0–0.2)
Globulin: 3.1 g/dL (ref 1.9–3.7)
Indirect Bilirubin: 0.2 mg/dL (ref 0.2–1.2)
Total Bilirubin: 0.3 mg/dL (ref 0.2–1.2)
Total Protein: 7.8 g/dL (ref 6.1–8.1)

## 2024-01-07 LAB — LIPID PANEL
Cholesterol: 204 mg/dL — ABNORMAL HIGH (ref <200)
HDL: 62 mg/dL (ref >=50)
LDL Cholesterol (Calc): 112 mg/dL — ABNORMAL HIGH
Non-HDL Cholesterol (Calc): 142 mg/dL — ABNORMAL HIGH (ref <130)
Total CHOL/HDL Ratio: 3.3 (calc) (ref <5.0)
Triglycerides: 183 mg/dL — ABNORMAL HIGH (ref <150)

## 2024-01-07 LAB — VITAMIN D 25 HYDROXY (VIT D DEFICIENCY, FRACTURES): Vit D, 25-Hydroxy: 49 ng/mL (ref 30–100)

## 2024-01-09 ENCOUNTER — Ambulatory Visit: Payer: Self-pay | Admitting: Family Medicine

## 2024-01-24 ENCOUNTER — Other Ambulatory Visit: Payer: Self-pay | Admitting: Family Medicine

## 2024-01-24 DIAGNOSIS — Z1231 Encounter for screening mammogram for malignant neoplasm of breast: Secondary | ICD-10-CM

## 2024-02-01 ENCOUNTER — Other Ambulatory Visit (HOSPITAL_COMMUNITY): Payer: Self-pay

## 2024-02-01 MED ORDER — LISDEXAMFETAMINE DIMESYLATE 20 MG PO CAPS
20.0000 mg | ORAL_CAPSULE | Freq: Every day | ORAL | 0 refills | Status: DC
  Filled 2024-02-01: qty 30, 30d supply, fill #0

## 2024-03-02 ENCOUNTER — Other Ambulatory Visit (HOSPITAL_COMMUNITY): Payer: Self-pay

## 2024-03-02 MED ORDER — LISDEXAMFETAMINE DIMESYLATE 20 MG PO CAPS
20.0000 mg | ORAL_CAPSULE | Freq: Every day | ORAL | 0 refills | Status: DC
  Filled 2024-03-02: qty 30, 30d supply, fill #0

## 2024-03-05 ENCOUNTER — Other Ambulatory Visit: Payer: Self-pay | Admitting: Medical Genetics

## 2024-03-09 ENCOUNTER — Other Ambulatory Visit: Payer: Self-pay | Admitting: Family Medicine

## 2024-03-09 DIAGNOSIS — Z1231 Encounter for screening mammogram for malignant neoplasm of breast: Secondary | ICD-10-CM

## 2024-03-12 ENCOUNTER — Encounter

## 2024-03-12 DIAGNOSIS — Z1231 Encounter for screening mammogram for malignant neoplasm of breast: Secondary | ICD-10-CM

## 2024-03-22 ENCOUNTER — Ambulatory Visit
Admission: RE | Admit: 2024-03-22 | Discharge: 2024-03-22 | Disposition: A | Discharge status: Home / Self Care | Source: Ambulatory Visit

## 2024-03-22 DIAGNOSIS — Z1231 Encounter for screening mammogram for malignant neoplasm of breast: Secondary | ICD-10-CM

## 2024-03-29 ENCOUNTER — Other Ambulatory Visit

## 2024-03-29 DIAGNOSIS — Z006 Encounter for examination for normal comparison and control in clinical research program: Secondary | ICD-10-CM

## 2024-04-11 ENCOUNTER — Other Ambulatory Visit (HOSPITAL_COMMUNITY): Payer: Self-pay

## 2024-04-11 MED ORDER — LISDEXAMFETAMINE DIMESYLATE 30 MG PO CAPS
30.0000 mg | ORAL_CAPSULE | Freq: Every day | ORAL | 0 refills | Status: DC
  Filled 2024-04-11: qty 30, 30d supply, fill #0

## 2024-04-13 LAB — GENECONNECT MOLECULAR SCREEN: Genetic Analysis Overall Interpretation: NEGATIVE

## 2024-04-16 ENCOUNTER — Other Ambulatory Visit (HOSPITAL_COMMUNITY): Payer: Self-pay

## 2024-04-20 ENCOUNTER — Other Ambulatory Visit (HOSPITAL_COMMUNITY): Payer: Self-pay

## 2024-04-20 ENCOUNTER — Encounter (HOSPITAL_COMMUNITY): Payer: Self-pay

## 2024-05-09 ENCOUNTER — Ambulatory Visit (INDEPENDENT_AMBULATORY_CARE_PROVIDER_SITE_OTHER): Admitting: Obstetrics and Gynecology

## 2024-05-09 ENCOUNTER — Other Ambulatory Visit (HOSPITAL_COMMUNITY)
Admission: RE | Admit: 2024-05-09 | Discharge: 2024-05-09 | Disposition: A | Discharge status: Home / Self Care | Source: Ambulatory Visit | Attending: Obstetrics & Gynecology | Admitting: Obstetrics & Gynecology

## 2024-05-09 ENCOUNTER — Encounter: Payer: Self-pay | Admitting: Obstetrics and Gynecology

## 2024-05-09 VITALS — BP 132/87 | HR 87 | Wt 172.0 lb

## 2024-05-09 DIAGNOSIS — Z975 Presence of (intrauterine) contraceptive device: Secondary | ICD-10-CM | POA: Diagnosis not present

## 2024-05-09 DIAGNOSIS — R87810 Cervical high risk human papillomavirus (HPV) DNA test positive: Secondary | ICD-10-CM | POA: Insufficient documentation

## 2024-05-09 DIAGNOSIS — Z8742 Personal history of other diseases of the female genital tract: Secondary | ICD-10-CM | POA: Diagnosis not present

## 2024-05-09 DIAGNOSIS — Z01419 Encounter for gynecological examination (general) (routine) without abnormal findings: Secondary | ICD-10-CM

## 2024-05-09 NOTE — Progress Notes (Signed)
 ANNUAL EXAM Patient name: Phyllis Shelton MRN [id number]  Date of birth: 1976/05/13 Chief Complaint:   Annual with pap smear  History of Present Illness:   Phyllis Shelton is a 48 y.o. [phone]  female being seen today for a routine annual exam.  Current complaints: here for annual with PAP. Had colposcopy last year  No LMP recorded. (Menstrual status: IUD).   The pregnancy intention screening data noted above was reviewed. Potential methods of contraception were discussed. The patient elected to proceed with No data recorded.   Gynecologic History Patient's last menstrual period was . Contraception: Mirena  IUD 2019 Last Pap: 03/30/23 LSIL HPV +.. Colpo 05/24/23 Last mammogram:03/2024 . Results were:Normal     01/06/2024    2:05 PM 05/06/2023    2:24 PM 03/30/2023    3:12 PM 03/24/2023    9:45 AM 03/03/2023    1:12 PM  Depression screen PHQ 2/9  Decreased Interest 0 1 0 0 0  Down, Depressed, Hopeless 0 0 0 0 0  PHQ - 2 Score 0 1 0 0 0  Altered sleeping 1 1 0 0 0  Tired, decreased energy 0 1 0 0 0  Change in appetite 0 0 0 0 0  Feeling bad or failure about yourself  0 1 0 0 0  Trouble concentrating 0 1 0 0 0  Moving slowly or fidgety/restless 0 0 0 0 0  Suicidal thoughts 0 0 0 0 0  PHQ-9 Score 1 5 0 0 0  Difficult doing work/chores Not difficult at all Not difficult at all  Not difficult at all Not difficult at all        05/06/2023    2:25 PM 03/30/2023    3:12 PM 03/03/2023    1:13 PM 02/10/2023    1:51 PM  GAD 7 : Generalized Anxiety Score  Nervous, Anxious, on Edge 0 0 0 3  Control/stop worrying 1 0 0 3  Worry too much - different things 1 0 0 3  Trouble relaxing 0 0 0 3  Restless 0 0 0 0  Easily annoyed or irritable 0 0 0 1  Afraid - awful might happen 0 0 0 3  Total GAD 7 Score 2 0 0 16  Anxiety Difficulty Not difficult at all  Not difficult at all Somewhat difficult     Review of Systems:   Pertinent items are noted in HPI Denies any headaches, blurred  vision, fatigue, shortness of breath, chest pain, abdominal pain, abnormal vaginal discharge/itching/odor/irritation, problems with periods, bowel movements, urination, or intercourse unless otherwise stated above. Pertinent History Reviewed:  Reviewed past medical,surgical, social and family history.  Reviewed problem list, medications and allergies. Physical Assessment:   Vitals:   05/09/24 1525  BP: 132/87  Pulse: 87  Weight: 172 lb (78 kg)  Body mass index is 34.74 kg/m.        Physical Examination:   General appearance - well appearing, and in no distress  Mental status - alert, oriented   Psych:  She has a normal mood and affect  Skin - warm and dry, normal color  Chest - effort normal  Neck:  midline trachea  Breasts -declined  Abdomen - soft, nontender, nondistended  Pelvic - VULVA: normal appearing vulva with no masses, tenderness or lesions  VAGINA: normal appearing vagina with normal color and discharge, no lesions  CERVIX: normal appearing cervix without discharge or lesions, no CMT  Thin prep pap is done w HR HPV cotesting  UTERUS: uterus is felt to be normal size, shape, consistency and nontender   ADNEXA: No adnexal masses or tenderness noted. IUD strings visualized  Extremities:  No swelling or varicosities noted  Chaperone present for exam  No results found for this or any previous visit (from the past 24 hours).  Assessment & Plan:  1. Encounter for annual routine gynecological examination (Primary) Mammogram next year  Pap today Established with primary care  2. IUD (intrauterine device) in place Mirena  2019, strings visualized, expire 2027 unless desire removal sooner   3. History of abnormal cervical Pap smear LSIL HPV + 2024, colpo CIN-1 repeat pap today  Follow up pending results - Cytology - PAP( [place])    Labs/procedures today:   Mammogram: in 1 year, or sooner if problems Colonoscopy: per GI, or sooner if problems  No orders of the  defined types were placed in this encounter.   Meds: No orders of the defined types were placed in this encounter.   Follow-up: Return in about 1 year (around 05/09/2025) for Phyllis [place] Nidia Delores, FNP

## 2024-05-09 NOTE — Progress Notes (Signed)
 Here for repeat PAP. Biopsy on 05/24/2023.  Has Mirena  IUD since 2019.

## 2024-05-15 LAB — CYTOLOGY - PAP
Comment: NEGATIVE
Comment: NEGATIVE
Comment: NEGATIVE
Diagnosis: UNDETERMINED — AB
HPV 16: NEGATIVE
HPV 18 / 45: NEGATIVE
High risk HPV: POSITIVE — AB

## 2024-05-21 ENCOUNTER — Other Ambulatory Visit (HOSPITAL_COMMUNITY): Payer: Self-pay

## 2024-05-21 MED ORDER — LISDEXAMFETAMINE DIMESYLATE 30 MG PO CAPS
30.0000 mg | ORAL_CAPSULE | Freq: Every day | ORAL | 0 refills | Status: DC
  Filled 2024-05-21: qty 30, 30d supply, fill #0

## 2024-05-23 ENCOUNTER — Ambulatory Visit: Payer: Self-pay | Admitting: Obstetrics and Gynecology

## 2024-06-22 ENCOUNTER — Other Ambulatory Visit (HOSPITAL_COMMUNITY): Payer: Self-pay

## 2024-06-22 MED ORDER — LISDEXAMFETAMINE DIMESYLATE 30 MG PO CAPS
30.0000 mg | ORAL_CAPSULE | Freq: Every day | ORAL | 0 refills | Status: DC
  Filled 2024-06-22: qty 30, 30d supply, fill #0

## 2024-07-17 ENCOUNTER — Other Ambulatory Visit (HOSPITAL_COMMUNITY)
Admission: RE | Admit: 2024-07-17 | Discharge: 2024-07-17 | Disposition: A | Discharge status: Home / Self Care | Source: Ambulatory Visit | Attending: Obstetrics and Gynecology | Admitting: Obstetrics and Gynecology

## 2024-07-17 ENCOUNTER — Encounter: Payer: Self-pay | Admitting: Obstetrics and Gynecology

## 2024-07-17 ENCOUNTER — Ambulatory Visit: Admitting: Obstetrics and Gynecology

## 2024-07-17 VITALS — BP 133/87 | HR 61 | Ht 59.0 in | Wt 170.0 lb

## 2024-07-17 DIAGNOSIS — R87610 Atypical squamous cells of undetermined significance on cytologic smear of cervix (ASC-US): Secondary | ICD-10-CM | POA: Diagnosis not present

## 2024-07-17 DIAGNOSIS — Z30432 Encounter for removal of intrauterine contraceptive device: Secondary | ICD-10-CM

## 2024-07-17 DIAGNOSIS — R87810 Cervical high risk human papillomavirus (HPV) DNA test positive: Secondary | ICD-10-CM

## 2024-07-17 DIAGNOSIS — Z3202 Encounter for pregnancy test, result negative: Secondary | ICD-10-CM

## 2024-07-17 LAB — POCT URINE PREGNANCY: Preg Test, Ur: NEGATIVE

## 2024-07-17 NOTE — Progress Notes (Signed)
 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   Past Surgical History:  Procedure Laterality Date   CESAREAN SECTION     INTRAUTERINE DEVICE (IUD) INSERTION     09-15-17 inserted   WISDOM TOOTH EXTRACTION     Family History  Problem Relation Age of Onset   Osteoporosis Mother        diagnosed at age 59   Cancer Mother        thyroid    Hypertension Father    Aneurysm Father    Alcohol abuse Father    ADD / ADHD Sister    ADD / ADHD Daughter    Dementia Maternal Grandmother    Diabetes Paternal Grandmother    Cancer Paternal Grandfather        stomach   Colon cancer Neg Hx    Colon polyps Neg Hx    Esophageal cancer Neg Hx    Stomach cancer Neg Hx    Rectal cancer Neg Hx    Social History   Socioeconomic History   Marital status: Married    Spouse name: ike   Number of children: 2   Years of education: Not on file   Highest education level: Master's degree (e.g., MA, MS, MEng, MEd, MSW, MBA)  Occupational History   Occupation: Magazine Features Editor: [employer]  Tobacco Use   Smoking status: Never   Smokeless tobacco: Never  Vaping Use   Vaping status: Never Used  Substance and Sexual Activity   Alcohol use: Yes    Alcohol/week: 1.0 standard drink of alcohol    Types: 1 Standard drinks or equivalent per week   Drug use: No   Sexual activity: Yes    Partners: Male    Birth control/protection: I.U.D.    Comment: husband vasectomy  Other Topics Concern   Not on file  Social History Narrative   Live at home w husband and 1 child   Right handed   Caffeine: 1 tea/soda a day occ   Social Drivers of Health   Tobacco Use: Low Risk (07/17/2024)   Patient History    Smoking Tobacco Use: Never     Smokeless Tobacco Use: Never    Passive Exposure: Not on file  Financial Resource Strain: Low Risk (02/10/2023)   Overall Financial Resource Strain (CARDIA)    Difficulty of Paying Living Expenses: Not hard at all  Food Insecurity: Low Risk (11/26/2023)   Received from [facility]   Epic    Within the past 12 months, you worried that your food would run out before you got money to buy more: Never true    Within the past 12 months, the food you bought just didn't last and you didn't have money to get more. : Never true  Transportation Needs: No Transportation Needs (11/26/2023)   Received from [facility]    In the past 12 months, has lack of reliable transportation kept you from medical appointments, meetings, work or from getting things needed for daily living? : No  Physical Activity: Unknown (02/10/2023)   Exercise Vital Sign    Days of Exercise per Week: 0 days    Minutes of Exercise per Session: Not on  file  Stress: Stress Concern Present (02/10/2023)   [employer] of Occupational Health - Occupational Stress Questionnaire    Feeling of Stress : Rather much  Social Connections: Moderately Integrated (02/10/2023)   Social Connection and Isolation Panel    Frequency of Communication with Friends and Family: Three times a week    Frequency of Social Gatherings with Friends and Family: More than three times a week    Attends Religious Services: Never    Database Administrator or Organizations: Yes    Attends Banker Meetings: More than 4 times per year    Marital Status: Married  Depression (PHQ2-9): Low Risk (01/06/2024)   Depression (PHQ2-9)    PHQ-2 Score: 1  Alcohol Screen: Low Risk (02/10/2023)   Alcohol Screen    Last Alcohol Screening Score (AUDIT): 2  Housing: Low Risk (11/26/2023)   Received from [facility]   Epic    What is your living situation today?: I have a steady place to live    Think about the place you live. Do you have  problems with any of the following? Choose all that apply:: None/None on this list  Utilities: Low Risk (11/26/2023)   Received from [facility]   Utilities    In the past 12 months has the electric, gas, oil, or water company threatened to shut off services in your home? : No  Health Literacy: Not on file   ROS See pertinent in HPI. All other systems reviewed and non contributory Blood pressure 133/87, pulse 61, height 4' 11 (1.499 m), weight 170 lb (77.1 kg). GENERAL: Well-developed, well-nourished female in no acute distress.  ABDOMEN: Soft, nontender, nondistended. No organomegaly. PELVIC: Normal external female genitalia. Vagina is pink and rugated.  Normal discharge. Normal appearing cervix with IUD strings and stem visible at external os. Chaperone present during the pelvic exam EXTREMITIES: No cyanosis, clubbing, or edema, 2+ distal pulses.  A/P 48 yo P2 with abnormal pap smear here for colposcopy Patient given informed consent, signed copy in the chart, time out was performed.  Placed in lithotomy position. Cervix viewed with speculum and colposcope after application of acetic acid.   Colposcopy adequate?  yes [place]et[place]hite lesions?no Punctation?no Mosaicism?  no Abnormal vasculature?  no Biopsies?no ECC?yes  COMMENTS: Patient was given post procedure instructions.  She will return in 2 weeks for results. Due to malpositioned IUD, patient opted to have it removed today. She was using it for cycle control given perimenopausal state and desires to assess her cycles without GYNECOLOGY CLINIC PROCEDURE NOTE  Phyllis Shelton is a 48 y.o. [id number] here for IUD removal. No GYN concerns.  IUD Removal  Patient identified, informed consent performed, consent signed.  Patient was in the dorsal lithotomy position, normal external genitalia was noted.  A speculum was placed in the patient's vagina, normal discharge was noted, no lesions. The cervix was visualized, no lesions, no abnormal  discharge.  The strings of the IUD were grasped and pulled using ring forceps. The IUD was removed in its entirety. Patient tolerated the procedure well.    Patient will keep track of a menstrual calendar. She is not sexually active   Winton Felt, MD

## 2024-07-17 NOTE — Progress Notes (Addendum)
 48 y.o. GYN presents for COLPO.  UPT Negative  HIGH RISK HPV ([place]): Positive Abnormal  YPV 16 ([place]): Negative HPV 18 / 45 (): Negative ADEQUACY: Satisfactory for evaluation; transformation zone component PRESENT. DIAGNOSIS: - Atypical squamous cells of undetermined significance (ASC-US ) Abnormal

## 2024-07-21 LAB — SURGICAL PATHOLOGY

## 2024-07-23 ENCOUNTER — Other Ambulatory Visit (HOSPITAL_COMMUNITY): Payer: Self-pay

## 2024-07-23 ENCOUNTER — Ambulatory Visit: Payer: Self-pay | Admitting: Obstetrics and Gynecology

## 2024-07-23 MED ORDER — LISDEXAMFETAMINE DIMESYLATE 30 MG PO CAPS
30.0000 mg | ORAL_CAPSULE | Freq: Every day | ORAL | 0 refills | Status: DC
  Filled 2024-07-23: qty 30, 30d supply, fill #0

## 2024-08-23 ENCOUNTER — Ambulatory Visit: Payer: Self-pay | Admitting: Obstetrics and Gynecology

## 2024-08-23 ENCOUNTER — Encounter: Payer: Self-pay | Admitting: Obstetrics and Gynecology

## 2024-08-23 VITALS — BP 135/89 | HR 89 | Ht 59.0 in | Wt 169.0 lb

## 2024-08-23 DIAGNOSIS — N871 Moderate cervical dysplasia: Secondary | ICD-10-CM

## 2024-08-23 DIAGNOSIS — Z01812 Encounter for preprocedural laboratory examination: Secondary | ICD-10-CM

## 2024-08-23 LAB — POCT URINE PREGNANCY: Preg Test, Ur: NEGATIVE

## 2024-08-23 MED ORDER — IBUPROFEN 200 MG PO TABS
600.0000 mg | ORAL_TABLET | Freq: Once | ORAL | Status: AC
  Administered 2024-08-23: 600 mg via ORAL

## 2024-08-23 MED ORDER — IBUPROFEN 600 MG PO TABS: 600.0000 mg | ORAL_TABLET | Freq: Four times a day (QID) | ORAL | 3 refills | Status: AC | PRN

## 2024-08-23 NOTE — Addendum Note (Signed)
 Addended by: LOVENIA JESUSA SAUNDERS on: 08/23/2024 02:41 PM   Modules accepted: Orders

## 2024-08-23 NOTE — Progress Notes (Signed)
 49 yo with abnormal pap smear and colposcopy here for scheduled LEEP procedure. 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   Past Surgical History:  Procedure Laterality Date   CESAREAN SECTION     INTRAUTERINE DEVICE (IUD) INSERTION     09-15-17 inserted   WISDOM TOOTH EXTRACTION     Family History  Problem Relation Age of Onset   Osteoporosis Mother        diagnosed at age 59   Cancer Mother        thyroid    Hypertension Father    Aneurysm Father    Alcohol abuse Father    ADD / ADHD Sister    ADD / ADHD Daughter    Dementia Maternal Grandmother    Diabetes Paternal Grandmother    Cancer Paternal Grandfather        stomach   Colon cancer Neg Hx    Colon polyps Neg Hx    Esophageal cancer Neg Hx    Stomach cancer Neg Hx    Rectal cancer Neg Hx    Social History   Socioeconomic History   Marital status: Married    Spouse name: ike   Number of children: 2   Years of education: Not on file   Highest education level: Master's degree (e.g., MA, MS, MEng, MEd, MSW, MBA)  Occupational History   Occupation: Magazine Features Editor: [employer]  Tobacco Use   Smoking status: Never   Smokeless tobacco: Never  Vaping Use   Vaping status: Never Used  Substance and Sexual Activity   Alcohol use: Yes    Alcohol/week: 1.0 standard drink of alcohol    Types: 1 Standard drinks or equivalent per week   Drug use: No   Sexual activity: Yes    Partners: Male    Birth control/protection: I.U.D.    Comment: husband vasectomy  Other Topics Concern   Not on file  Social History Narrative   Live at home w husband and 1 child   Right handed   Caffeine: 1 tea/soda a day occ   Social Drivers of Health   Tobacco Use: Low Risk (08/23/2024)   Patient History    Smoking Tobacco Use: Never     Smokeless Tobacco Use: Never    Passive Exposure: Not on file  Financial Resource Strain: Low Risk (02/10/2023)   Overall Financial Resource Strain (CARDIA)    Difficulty of Paying Living Expenses: Not hard at all  Food Insecurity: Low Risk (11/26/2023)   Received from [facility]   Epic    Within the past 12 months, you worried that your food would run out before you got money to buy more: Never true    Within the past 12 months, the food you bought just didn't last and you didn't have money to get more. : Never true  Transportation Needs: No Transportation Needs (11/26/2023)   Received from [facility]    In the past 12 months, has lack of reliable transportation kept you from medical appointments, meetings, work or from getting things needed for daily living? : No  Physical Activity: Unknown (02/10/2023)   Exercise Vital Sign    Days of Exercise per Week: 0 days    Minutes of Exercise per Session: Not on file  Stress: Stress Concern Present (02/10/2023)   [employer] of Occupational Health - Occupational Stress Questionnaire    Feeling of Stress : Rather much  Social Connections: Moderately Integrated (02/10/2023)   Social Connection and Isolation Panel    Frequency of Communication with Friends and Family: Three times a week    Frequency of Social Gatherings with Friends and Family: More than three times a week    Attends Religious Services: Never    Database Administrator or Organizations: Yes    Attends Banker Meetings: More than 4 times per year    Marital Status: Married  Depression (PHQ2-9): Low Risk (01/06/2024)   Depression (PHQ2-9)    PHQ-2 Score: 1  Alcohol Screen: Low Risk (02/10/2023)   Alcohol Screen    Last Alcohol Screening Score (AUDIT): 2  Housing: Low Risk (11/26/2023)   Received from [facility]   Epic    What is your living situation today?: I have a steady place to live    Think about the place you live. Do you have  problems with any of the following? Choose all that apply:: None/None on this list  Utilities: Low Risk (11/26/2023)   Received from [facility]   Utilities    In the past 12 months has the electric, gas, oil, or water company threatened to shut off services in your home? : No  Health Literacy: Not on file   ROS See pertinent in HPI. All other systems reviewed and non contributory Blood pressure 135/89, pulse 89, height 4' 11 (1.499 m), weight 169 lb (76.7 kg).  GENERAL: Well-developed, well-nourished female in no acute distress.  ABDOMEN: Soft, nontender, nondistended. No organomegaly. PELVIC: Normal external female genitalia. Vagina is pink and rugated.  Normal discharge. Normal appearing cervix. Chaperone present during the pelvic exam EXTREMITIES: No cyanosis, clubbing, or edema, 2+ distal pulses.   A/P 49 yo here for LEEP   Patient identified, informed consent obtained, signed copy in chart, time out performed.  Pap smear and colposcopy reviewed.   Pap ASCUS + HPV Colpo Biopsy HGSIL ECC HGSIL Teflon coated speculum with smoke evacuator placed.  Cervix visualized. Paracervical block placed.  A medium size LOOP used to remove cone of cervix using blend of cut and cautery on LEEP machine.  Edges/Base cauterized with Ball.  Monsel's solution used for hemostasis.  Patient tolerated procedure well.  Patient given post procedure instructions.  Follow up for repeat pap in 12 months pending results

## 2024-08-24 ENCOUNTER — Other Ambulatory Visit (HOSPITAL_COMMUNITY): Payer: Self-pay

## 2024-08-24 MED ORDER — LISDEXAMFETAMINE DIMESYLATE 30 MG PO CAPS
30.0000 mg | ORAL_CAPSULE | Freq: Every day | ORAL | 0 refills | Status: AC
  Filled 2024-08-24: qty 30, 30d supply, fill #0

## 2025-01-07 ENCOUNTER — Encounter: Admitting: Family Medicine
